# Patient Record
Sex: Male | Born: 1975
Health system: Southern US, Community
[De-identification: ages and names within clinical notes are randomized; demographics above are authoritative.]

## PROBLEM LIST (undated history)

## (undated) DIAGNOSIS — R079 Chest pain, unspecified: Secondary | ICD-10-CM

## (undated) DIAGNOSIS — T7840XA Allergy, unspecified, initial encounter: Secondary | ICD-10-CM

## (undated) DIAGNOSIS — E785 Hyperlipidemia, unspecified: Secondary | ICD-10-CM

## (undated) HISTORY — PX: KNEE ARTHROSCOPY: SUR90

## (undated) HISTORY — DX: Allergy, unspecified, initial encounter: T78.40XA

---

## 2002-09-06 ENCOUNTER — Emergency Department (HOSPITAL_COMMUNITY): Admission: EM | Admit: 2002-09-06 | Discharge: 2002-09-06 | Payer: Self-pay | Admitting: Emergency Medicine

## 2002-09-08 ENCOUNTER — Emergency Department (HOSPITAL_COMMUNITY): Admission: EM | Admit: 2002-09-08 | Discharge: 2002-09-08 | Payer: Self-pay | Admitting: Emergency Medicine

## 2002-11-10 ENCOUNTER — Emergency Department (HOSPITAL_COMMUNITY): Admission: EM | Admit: 2002-11-10 | Discharge: 2002-11-10 | Payer: Self-pay | Admitting: Emergency Medicine

## 2003-11-13 ENCOUNTER — Inpatient Hospital Stay (HOSPITAL_COMMUNITY): Admission: AD | Admit: 2003-11-13 | Discharge: 2003-11-16 | Payer: Self-pay | Admitting: Emergency Medicine

## 2004-07-29 ENCOUNTER — Emergency Department (HOSPITAL_COMMUNITY): Admission: EM | Admit: 2004-07-29 | Discharge: 2004-07-29 | Payer: Self-pay | Admitting: Emergency Medicine

## 2004-12-10 ENCOUNTER — Emergency Department (HOSPITAL_COMMUNITY): Admission: EM | Admit: 2004-12-10 | Discharge: 2004-12-10 | Payer: Self-pay | Admitting: Emergency Medicine

## 2005-07-08 ENCOUNTER — Emergency Department (HOSPITAL_COMMUNITY): Admission: EM | Admit: 2005-07-08 | Discharge: 2005-07-08 | Payer: Self-pay | Admitting: Family Medicine

## 2005-09-30 ENCOUNTER — Emergency Department (HOSPITAL_COMMUNITY): Admission: EM | Admit: 2005-09-30 | Discharge: 2005-09-30 | Payer: Self-pay | Admitting: Emergency Medicine

## 2005-12-26 ENCOUNTER — Inpatient Hospital Stay (HOSPITAL_COMMUNITY): Admission: RE | Admit: 2005-12-26 | Discharge: 2005-12-30 | Payer: Self-pay | Admitting: Psychiatry

## 2005-12-27 ENCOUNTER — Ambulatory Visit: Payer: Self-pay | Admitting: Psychiatry

## 2010-10-12 ENCOUNTER — Encounter (INDEPENDENT_AMBULATORY_CARE_PROVIDER_SITE_OTHER): Payer: Self-pay | Admitting: Nurse Practitioner

## 2010-10-12 ENCOUNTER — Ambulatory Visit
Admission: RE | Admit: 2010-10-12 | Discharge: 2010-10-12 | Payer: Self-pay | Source: Home / Self Care | Attending: Nurse Practitioner | Admitting: Nurse Practitioner

## 2010-10-12 DIAGNOSIS — R5381 Other malaise: Secondary | ICD-10-CM | POA: Insufficient documentation

## 2010-10-12 DIAGNOSIS — M25519 Pain in unspecified shoulder: Secondary | ICD-10-CM | POA: Insufficient documentation

## 2010-10-12 DIAGNOSIS — N529 Male erectile dysfunction, unspecified: Secondary | ICD-10-CM | POA: Insufficient documentation

## 2010-10-12 DIAGNOSIS — F39 Unspecified mood [affective] disorder: Secondary | ICD-10-CM | POA: Insufficient documentation

## 2010-10-12 DIAGNOSIS — R5383 Other fatigue: Secondary | ICD-10-CM

## 2010-10-12 DIAGNOSIS — F17200 Nicotine dependence, unspecified, uncomplicated: Secondary | ICD-10-CM | POA: Insufficient documentation

## 2010-10-12 DIAGNOSIS — M25569 Pain in unspecified knee: Secondary | ICD-10-CM | POA: Insufficient documentation

## 2010-10-12 DIAGNOSIS — E669 Obesity, unspecified: Secondary | ICD-10-CM | POA: Insufficient documentation

## 2010-10-12 LAB — CONVERTED CEMR LAB
ALT: 14 units/L (ref 0–53)
AST: 16 units/L (ref 0–37)
Albumin: 4.7 g/dL (ref 3.5–5.2)
Alkaline Phosphatase: 72 units/L (ref 39–117)
BUN: 12 mg/dL (ref 6–23)
Basophils Absolute: 0.1 10*3/uL (ref 0.0–0.1)
Basophils Relative: 1 % (ref 0–1)
CO2: 27 meq/L (ref 19–32)
Calcium: 9.8 mg/dL (ref 8.4–10.5)
Chloride: 102 meq/L (ref 96–112)
Creatinine, Ser: 0.93 mg/dL (ref 0.40–1.50)
Eosinophils Absolute: 0.4 10*3/uL (ref 0.0–0.7)
Eosinophils Relative: 6 % — ABNORMAL HIGH (ref 0–5)
Glucose, Bld: 80 mg/dL (ref 70–99)
HCT: 43.9 % (ref 39.0–52.0)
Hemoglobin: 15.1 g/dL (ref 13.0–17.0)
Lymphocytes Relative: 24 % (ref 12–46)
Lymphs Abs: 1.5 10*3/uL (ref 0.7–4.0)
MCHC: 34.4 g/dL (ref 30.0–36.0)
MCV: 92.8 fL (ref 78.0–100.0)
Monocytes Absolute: 0.5 10*3/uL (ref 0.1–1.0)
Monocytes Relative: 8 % (ref 3–12)
Neutro Abs: 3.9 10*3/uL (ref 1.7–7.7)
Neutrophils Relative %: 61 % (ref 43–77)
Platelets: 249 10*3/uL (ref 150–400)
Potassium: 4.7 meq/L (ref 3.5–5.3)
RBC: 4.73 M/uL (ref 4.22–5.81)
RDW: 13.4 % (ref 11.5–15.5)
Rapid HIV Screen: NEGATIVE
Sex Hormone Binding: 42 nmol/L (ref 13–71)
Sodium: 139 meq/L (ref 135–145)
TSH: 0.46 microintl units/mL (ref 0.350–4.500)
Testosterone Free: 92.7 pg/mL (ref 47.0–244.0)
Testosterone-% Free: 1.8 % (ref 1.6–2.9)
Testosterone: 512.13 ng/dL (ref 250–890)
Total Bilirubin: 0.3 mg/dL (ref 0.3–1.2)
Total Protein: 7.2 g/dL (ref 6.0–8.3)
WBC: 6.4 10*3/uL (ref 4.0–10.5)

## 2010-10-14 ENCOUNTER — Encounter (INDEPENDENT_AMBULATORY_CARE_PROVIDER_SITE_OTHER): Payer: Self-pay | Admitting: Nurse Practitioner

## 2010-10-27 NOTE — Assessment & Plan Note (Signed)
Summary: NEW - Establish Care   Vital Signs:  Patient profile:   35 year old male Height:      71 inches Weight:      196 pounds BMI:     27.44 Temp:     97.7 degrees F oral Pulse rate:   72 / minute Pulse rhythm:   regular Resp:     18 per minute BP sitting:   120 / 70  (left arm) Cuff size:   regular  Vitals Entered By: Armenia Shannon (October 12, 2010 9:48 AM)  Nutrition Counseling: Patient's BMI is greater than 25 and therefore counseled on weight management options. CC: NP...Marland Kitchen pt says his right shoulder has a knot on it and hurt... pt did not use any thing on  shoulder... pt says he takes abilify 5mg  but did not bring bottle... Is Patient Diabetic? No Pain Assessment Patient in pain? no       Does patient need assistance? Functional Status Self care Ambulation Normal   CC:  NP...Marland Kitchen pt says his right shoulder has a knot on it and hurt... pt did not use any thing on  shoulder... pt says he takes abilify 5mg  but did not bring bottle....  History of Present Illness:  Pt into the office to establsh care  PMH - nonsignificant PSH - left arthroscopy in 2010  Right shoulder - "knot" present x 6 months ago. About 6 months ago he noticed that his shoulder started was hurting.  Shortly after that he noticed and a lump on his shoulder.  Not a major problem but just notices from time to time. Right hand dominant.  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 no     Have you felt little pleasure in things? no     Depression Counseling: not indicated; screening negative for depression     Drug Use:  never  Current Medications (verified): 1)  None  Allergies (verified): No Known Drug Allergies  Past History:  Past Surgical History: left surgery arthoscopy 2010  Family History: mother - htn father - htn 2 sisters - healthy 1 brother - healthy  Social History: 6 children married tobacco - 1 ppd ETOH - quit 2010 Drug - never Smoking Status:  current Packs/Day:  0.5 Drug Use:  never  Review of Systems General:  Denies fever. CV:  Denies chest pain or discomfort. Resp:  Denies cough. GI:  Denies abdominal pain, nausea, and vomiting. GU:  Complains of erectile dysfunction; Pt indicates that his urge to perform had decreased. He is married and states that both his urge and ability to perform has decreased. MS:  Complains of joint pain.  Physical Exam  General:  alert.   Head:  normocephalic.   Lungs:  normal breath sounds.   Heart:  normal rate and regular rhythm.   Abdomen:  normal bowel sounds.   Neurologic:  alert & oriented X3.     Shoulder/Elbow Exam  Shoulder Exam:    Right:    Inspection:  Normal  Location:  right AC joint    Stability:  stable    Tenderness:  no    Swelling:  no    Erythema:  no   Impression & Recommendations:  Problem # 1:  SHOULDER PAIN, RIGHT (ICD-719.41) advised pt this is likely environmental will start ibuprofen heat to affected area  Problem # 2:  NEED PROPHYLACTIC VACCINATION&INOCULATION FLU (ICD-V04.81) given today in office  Problem # 3:  TOBACCO ABUSE (ICD-305.1) advised cessation  Problem # 4:  OBESITY (ICD-278.00)  advised pt that he needs to exercise  Orders: T-TSH ([address])  Problem # 5:  FATIGUE (ICD-780.79) will check labs today Orders: T-Comprehensive Metabolic Panel (09811-91478) T-CBC w/Diff (29562-13086) T-Testosterone, Free and Total [phone]) T-TSH (32440-10272)  Complete Medication List: 1)  Abilify 5 Mg Tabs (Aripiprazole) .... One tablet by mouth daily  Other  Orders: Flu Vaccine 7yrs + (53664) Admin 1st Vaccine (40347) Rapid HIV  (42595)  Patient Instructions: 1)  Sign a release to get records from  2)  [facility] 3)  [address] 4)  [place]  [address] 5)  Right shoulder - most likely from overuse.  May take ibuprofen as needed for pain and can apply heat.  If it continues to be a problem then you may need x-rays.  Try to alternate sleeping on your shoulder at night 6)  Your labs will be checked today and you will be notified of the results. 7)  Follow up as needed   Orders Added: 1)  Flu Vaccine 39yrs + [90658] 2)  Admin 1st Vaccine [90471] 3)  New Patient Level III [99203] 4)  T-Comprehensive Metabolic Panel [80053-22900] 5)  T-CBC w/Diff [64332-95188] 6)  T-Testosterone, Free and Total [84270-[address]] 7)  Rapid HIV  [92370] 8)  T-TSH [41660-63016]   Immunizations Administered:  Influenza Vaccine # 1:    Vaccine Type: Fluvax 3+    Site: right deltoid    Mfr: GlaxoSmithKline    Dose: 0.5 ml    Route: IM    Given by: Armenia Shannon    Exp. Date: 03/24/2010    Lot #: [id number]    VIS given: 04/19/10 version given October 12, 2010.  Flu Vaccine Consent Questions:    Do you have a history of severe allergic reactions to this vaccine? no    Any prior history of allergic reactions to egg and/or gelatin? no    Do you have a sensitivity to the preservative Thimersol? no    Do you have a past history of Guillan-Barre Syndrome? no    Do you currently have an acute febrile illness? no    Have you ever had a severe reaction to latex? no    Vaccine information given and explained to patient? yes   Immunizations Administered:  Influenza Vaccine # 1:    Vaccine Type: Fluvax 3+    Site: right deltoid    Mfr: GlaxoSmithKline    Dose: 0.5 ml    Route: IM    Given by: Armenia Shannon    Exp. Date: 03/24/2010    Lot #: [id number]    VIS given: 04/19/10 version given October 12, 2010.      Prevention & Chronic Care Immunizations   Influenza vaccine: Fluvax 3+  (10/12/2010)    Tetanus booster: Not documented    Pneumococcal vaccine: Not documented  Other Screening   Smoking status: current  (10/12/2010)  Lipids   Total Cholesterol: Not documented   LDL: Not documented   LDL Direct:  Not documented   HDL: Not documented   Triglycerides: Not documented   Nursing Instructions: Give Flu vaccine today   Laboratory Results    Other Tests  Rapid HIV: negative

## 2010-10-27 NOTE — Letter (Signed)
Summary: *HSN Results Follow up  [facility]  [address] [place], [place] [address]   Phone: [phone]  Fax: [phone]      10/14/2010   [name] [address] [place], [place]  [address]   Dear  [name],                            ____S.Drinkard,FNP   ____D. Gore,FNP       ____B. McPherson,MD   ____V. Rankins,MD    ____E. Mulberry,MD    _X___N. Daphine Deutscher, FNP  ____D. Reche Dixon, MD    ____K. Philipp Deputy, MD    ____Other     This letter is to inform you that your recent test(s):  _______Pap Smear    ___X____Lab Test     _______X-ray    ___X____ is within acceptable limits  _______ requires a medication change  _______ requires a follow-up lab visit  _______ requires a follow-up visit with your provider   Comments:  Labs done during recent office visit are normal.       _________________________________________________________ If you have any questions, please contact our office [phone].                    Sincerely,    Lehman Prom FNP [facility]

## 2011-02-10 NOTE — Discharge Summary (Signed)
NAME:  Keith Green, Keith Green                       ACCOUNT NO.:  [id number]   MEDICAL RECORD NO.:  [id number]                   PATIENT TYPE:  INP   LOCATION:  5714                                 FACILITY:  [facility]   PHYSICIAN:  Madaline Guthrie, M.D.                 DATE OF BIRTH:  1976/01/27   DATE OF ADMISSION:  11/13/2003  DATE OF DISCHARGE:  11/16/2003                                 DISCHARGE SUMMARY   DISCHARGE DIAGNOSES:  1. Acute pancreatitis.  2. Polysubstance abuse.   DISCHARGE MEDICATIONS:  None.   DISPOSITION:  Keith Green was discharged home in stable condition.  He was  to follow up at [facility] on November 30, 2003 at 1:30 p.m.  to ensure his pancreatitis is resolved.  It would also be good to discuss  further alcohol and substance abuse cessation at that time and determine if  the patient contacted ACS .   PROCEDURES:  On the day of admission, the patient had an abdominal  ultrasound to rule out gallstones.  There was no evidence of stones or  gallbladder wall thickening and no evidence of ductal dilatation.  The  kidneys showed no evidence of hydronephrosis or masses.  There was no  evidence of splenomegaly or abdominal aortic aneurysm.  There was no  evidence of ascites.  Hospital day #1, the patient underwent a CT of the  abdomen and pelvis which showed inflammation in the area of the gallbladder  and the head of the pancreas.  It showed some slight prominence of the  common duct and pancreatic duct.  The distal common duct obstruction at the  ampulla could not be excluded.  The pelvis showed a small amount of free  fluid.   CONSULTATIONS:  [facility] Assessment Team.   HISTORY AND PHYSICAL:  Keith Green is a 35 year old male with a history  significant for polysubstance abuse who presented on day of admission with  severe abdominal pain, nausea, vomiting, and diarrhea.  He states that he  drinks about four 40-ounce beers daily.  He says that one week  prior to  admission he had had an evening of excessive alcohol and was drinking liquor  on top of his normal daily beer.  He reports that this is the first episode  of this type of pain.  He reports to having had shakes with withdrawal but  no history of seizures or DT.  He does drink some mornings when he wakes up  and he thinks that he presently drinks too much.  His labs on admission were  sodium 139, potassium 3.3, chloride 102, bicarb 28, BUN 7, creatinine 0.9,  glucose 163.  His bilirubin was 0.3, alkaline phosphatase was 83, AST was  52, ALT 49, total protein was 6.8, albumin 3.9, calcium 8.8.  His lipase on  admission was 778.  His serum alcohol was 268.  His urine drug screen was  significant for cocaine.  Urinalysis was within normal limits.  His  admission vital signs were pulse 73, blood pressure 131/71, temperature  96.9, respiratory rate 20.  His oxygen saturation was 98% on room air.  The  patient was lying in bed in no apparent distress.  Pupils equal, round,  reactive to light.  His throat was negative for any erythema.  His neck was  supple with no lymphadenopathy.  Respirations were clear to auscultation  bilaterally without wheezes, rales, or rhonchi.  Cardiovascular exam was  regular rate and rhythm with no murmurs, rubs, or gallops.  Abdomen was soft  and tender in the right upper quadrant.  Positive bowel sounds.  There was  some guarding but no rebound tenderness.  Lower extremities showed no edema  and 2+ dorsalis pedis pulses bilaterally.   HOSPITAL PROBLEMS:  Problem 1.  ACUTE PANCREATITIS:  On day of admission,  abdominal ultrasound was done to rule out gallstones.  Ultrasound was  negative for any signs of gallbladder inflammation and gallstones.  However,  an abdominal CT was ordered for the following day.  The CT did show some  inflammation in the area of the pancreas and gallbladder as was expected.  However, his symptoms were not related to food intake and  this was a rather  abrupt onset.  Due to his extensive alcohol background, it was felt that his  pancreatitis was most likely alcohol etiology.  No further imaging was done.  The patient was initially placed on IV fluids and morphine PCA while being  NPO.  Hospital day #1, the patient stated that he did have a desire to eat  and the pain had decreased so his PCA was stopped.  Later in the day he did  tolerate clear liquids without any nausea or vomiting.  The next day he was  started on a regular diet which he tolerated without problems.  The  following morning he felt fine, afebrile, without pain having not had any  pain medications and tolerating good p.o.   Problem 2.  POLYSUBSTANCE ABUSE:  I spoke with the patient on 2 occasions  about the need to stop the alcohol.  He also spoke with the [facility] Assessment  Team on hospital day #1 and received numbers and information for ACS.  The  patient reports that he does realize he has been drinking more over the past  year since he has been laid off and feels like he needs to stop.   DISCHARGE VITALS:  Temperature was 97, pulse ranged from 57 to 80, blood  pressure was 135 to 158 over 76 to 96, respiratory rate was 18 to 20.  Oxygen saturation was 96 to 100% on room air.  His most recent CBC prior to  discharge showed a white blood cell count of 11, hemoglobin and hematocrit  were 15.7 and 45.4 respectively, platelets were 192.  He had an amylase done  that was 174 also consistent with pancreatitis.  Had a repeat lipase which  was 46.      Eliseo Gum, M.D.                      Madaline Guthrie, M.D.    KC/MEDQ  D:  11/20/2003  T:  11/21/2003  Job:  [id number]

## 2011-02-10 NOTE — Discharge Summary (Signed)
Keith Green, Keith Green            ACCOUNT NO.:  [id number]   MEDICAL RECORD NO.:  [id number]          PATIENT TYPE:  IPS   LOCATION:  0403                          [facility]ILITY[facility]  [facility]   PHYSICIAN:  Geoffery Lyons, M.D.      DATE OF BIRTH:  11-19-1975   DATE OF ADMISSION:  12/26/2005  DATE OF DISCHARGE:  12/30/2005                                 DISCHARGE SUMMARY   CHIEF COMPLAINT AND PRESENT ILLNESS:  This is the first admission to [facility] for this 35 year old single African-American male  voluntarily admitted.  History of auditory hallucinations, have been  intense, hearing voices that are not command.  They get him up at night.  He  has been having hallucinations for years, since age 40.  Has never been  treated before for any mental illness.  His wife is worried because the  patient has been staying up all night due to hearing the voices.  Appetite  has been fair.  Denies any substance abuse.  Denies any active suicidal or  homicidal ideation.   PAST PSYCHIATRIC HISTORY:  First time at [facility], no current  outpatient treatment.   ALCOHOL AND DRUG HISTORY:  Denies abuse of any substances.   MEDICAL HISTORY:  Noncontributory.   MEDICATIONS:  None.   PHYSICAL EXAMINATION:  Performed and failed to show any acute findings.   LABORATORY WORK UP:  CBC, white blood cells 5.8, hemoglobin 15.2.  Blood  chemistry, sodium 138, potassium 3.7, glucose 83.  Liver enzymes, SGOT 37,  SGPT 39, total bilirubin 0.9.  TSH 1.427.  Drug screen negative for  substance abuse.   MENTAL STATUS EXAM:  Reveals a fully alert, cooperative male with good eye  contact.  Very soft spoken, mood is neutral.  Affect is pleasant, somewhat  flat.  Thought process, endorses auditory hallucinations.  No evidence of  delusion, no suicidal or homicidal ideation.  Cognition well-preserved.   ADMISSION DIAGNOSES:  AXIS I:  Psychotic disorder, not otherwise specified.  AXIS II:  No  diagnosis.  AXIS III:  No diagnosis.  AXIS IV:  Moderate.  AXIS V:  GAF upon admission 30; highest in the last year 70.   HOSPITAL COURSE:  He was admitted.  He was started in individual and group  psychotherapy.  He was given some Ativan and some Haldol to begin with.  He  was placed on Risperdal.  He was given Ambien for sleep.  Endorses that he  has been hearing voices for the last 2 weeks, cannot explain what is going  on.  Brother history of schizophrenia.  He was killed in an accident.  He  himself is facing charges.  Will go to court in April.  Past history of  criminal behaviors, lost a good job due to his past criminal behavior.  Pending some drug charges, pretty upset with the situation.  __________.  Help him sleep, also new baby.  There were some elements of being anxious,  panicky, sleeping.  As we increased the medication the voices started  decreasing.  His wife was supportive.  But by April  6 he woke up with the  voices, very upset with the situation.  We continued to increase the  Risperdal.  On April 7 he endorsed that he had been doing really well.  He  slept all night.  There were no voices.  His mood was better.  His affect  was brighter, committed to doing well.  Willing to go home and face all the  stressors that he has to face.  Encouraged, committed to doing well.   DISCHARGE DIAGNOSES:  AXIS I:  Psychotic disorder, not otherwise specified.  AXIS II:  No diagnosis.  AXIS III:  No diagnosis.  AXIS IV:  Moderate.  AXIS V:  GAF upon discharge 50-55.   DISCHARGE MEDICATIONS:  1.  Risperdal 0.5 mg one-half twice a day and three at bedtime.  2.  Ambien 10 mg at bedtime as needed for sleep.  3.  __________ 25 mg three times a day as needed.   FOLLOW UP:  Alinda Money __________ and [facility].      Geoffery Lyons, M.D.  Electronically Signed     IL/MEDQ  D:  01/09/2006  T:  01/10/2006  Job:  [id number]

## 2011-02-10 NOTE — H&P (Signed)
NAMECEBASTIAN, Keith Green            ACCOUNT NO.:  [id number]   MEDICAL RECORD NO.:  [id number]          PATIENT TYPE:  IPS   LOCATION:  0403                          [facility]ILITY[facility]  [facility]   PHYSICIAN:  Geoffery Lyons, M.D.      DATE OF BIRTH:  January 12, 1976   DATE OF ADMISSION:  12/26/2005  DATE OF DISCHARGE:  12/30/2005                         PSYCHIATRIC ADMISSION ASSESSMENT   IDENTIFYING INFORMATION:  This is a 35 year old single African-American male  voluntarily admitted on December 26, 2005.   HISTORY OF PRESENT ILLNESS:  The patient presents with a history of auditory  hallucinations, states that they have been intense, hearing voices that are  non-command.  The patient states that they keep him up at night.  He has  been having hallucinations for years since the age of 36.  He states he has  never been treated before for any mental illness.  He states his wife is  worried because the patient has been staying up all night due to him hearing  voices.  His appetite has been fair.  He denies any substance abuse.  He  denies any suicidal or homicidal ideation.  The patient states that one of  his stressors is that he was recently terminated from work due to his past  criminal record.   PAST PSYCHIATRIC HISTORY:  First admission to [facility].  No  current outpatient treatment.  There is no prior suicidal gestures or  attempt.   SOCIAL HISTORY:  This is a 35 year old married African-American male who has  three children, age 26, 68 and 75 months.  His wife is currently pregnant and  due in April.  The patient lives with his wife and three children.  Unemployed.  He has completed 10 years in school and got his GED.  Has a  court date upcoming for assault on a government official.  Has been in jail  multiple times and was charged with armed robbery in the past.   FAMILY HISTORY:  Brother with schizophrenia.   ALCOHOL/DRUG HISTORY:  The patient smokes.  Denies any alcohol or drug  use.   PRIMARY CARE PHYSICIAN:  None.   MEDICAL PROBLEMS:  None.   MEDICATIONS:  None.   ALLERGIES:  No known allergies.   PHYSICAL EXAMINATION:  This is a healthy-appearing male.  He was assessed at  [facility].  Temperature 97.3, heart rate 80,  respiratory rate 20, 99% saturated.  He is 6 feet 1 inch tall, weight 171  pounds.   REVIEW OF SYSTEMS:  Noncontributory.   LABORATORY DATA:  CBC is within normal limits.  CMET is within normal  limits.  TSH is 1.427.  Acetaminophen level less than 10.  Salicylate level  less than 4.   MENTAL STATUS EXAM:  This is a fully alert, cooperative male with little eye  contact.  Speech is soft-spoken.  His mood is neutral.  His affect is  pleasant, somewhat flat.  Thought processes endorsing auditory  hallucinations.  No suicidal ideation.  Cognitive function is intact.  Memory is fair.  Judgment and insight are fair.  Concentration intact.  DIAGNOSES:  AXIS I:  Psychosis not otherwise specified.  AXIS II:  Deferred.  AXIS III:  None.  AXIS IV:  Problems with occupation, legal system.  AXIS V:  Current 30.   PLAN:  Stabilize mood and thinking.  Contract for safety.  Will initiate  Risperdal for psychotic symptoms.  Will have a family session with wife for  support.  The patient is to be medication compliant.  To follow up with  [facility].   TENTATIVE LENGTH OF STAY:  Four to five days.      Landry Corporal, N.P.      Geoffery Lyons, M.D.  Electronically Signed    JO/MEDQ  D:  12/30/2005  T:  01/01/2006  Job:  [id number]

## 2011-10-22 ENCOUNTER — Emergency Department (HOSPITAL_COMMUNITY)
Admission: EM | Admit: 2011-10-22 | Discharge: 2011-10-22 | Disposition: A | Discharge status: Home / Self Care | Payer: Medicaid Other | Attending: Emergency Medicine | Admitting: Emergency Medicine

## 2011-10-22 ENCOUNTER — Encounter (HOSPITAL_COMMUNITY): Payer: Self-pay | Admitting: *Deleted

## 2011-10-22 DIAGNOSIS — S61209A Unspecified open wound of unspecified finger without damage to nail, initial encounter: Secondary | ICD-10-CM | POA: Insufficient documentation

## 2011-10-22 DIAGNOSIS — W292XXA Contact with other powered household machinery, initial encounter: Secondary | ICD-10-CM | POA: Insufficient documentation

## 2011-10-22 DIAGNOSIS — S61319A Laceration without foreign body of unspecified finger with damage to nail, initial encounter: Secondary | ICD-10-CM

## 2011-10-22 MED ORDER — TETANUS-DIPHTH-ACELL PERTUSSIS 5-2.5-18.5 LF-MCG/0.5 IM SUSP
0.5000 mL | Freq: Once | INTRAMUSCULAR | Status: AC
  Administered 2011-10-22: 0.5 mL via INTRAMUSCULAR
  Filled 2011-10-22: qty 0.5

## 2011-10-22 NOTE — ED Provider Notes (Signed)
Medical screening examination/treatment/procedure(s) were performed by non-physician practitioner and as supervising physician I was immediately available for consultation/collaboration.    Ndeye Tenorio R Glyn Gerads, MD 10/22/11 0632 

## 2011-10-22 NOTE — ED Provider Notes (Signed)
History     CSN: [id number]  Arrival date & time 10/22/11  0009   None     Chief Complaint  Patient presents with  . Laceration    (Consider location/radiation/quality/duration/timing/severity/associated sxs/prior treatment) HPI Comments: Patient was using a slicer about 10 AM this morning when he accidentally caught his right middle finger distal tip in slicer.  He washed the area and applied a Band-Aid.  His head to change the bandage.  Several times today as he keeps using his tetanus status is out of date  Patient is a 36 y.o. male presenting with skin laceration. The history is provided by the patient.  Laceration  The incident occurred 12 to 24 hours ago. The laceration is 1 cm in size. The pain is at a severity of 2/10. The pain is mild. The pain has been constant since onset. His tetanus status is out of date.    History reviewed. No pertinent past medical history.  History reviewed. No pertinent past surgical history.  History reviewed. No pertinent family history.  History  Substance Use Topics  . Smoking status: Current Everyday Smoker  . Smokeless tobacco: Not on file  . Alcohol Use: No      Review of Systems  Allergies  Review of patient's allergies indicates no known allergies.  Home Medications  No current outpatient prescriptions on file.  BP 131/86  Pulse 64  Temp(Src) 97.5 F (36.4 C) (Oral)  Resp 16  SpO2 98%  Physical Exam  ED Course  Procedures (including critical care time)  Labs Reviewed - No data to display No results found.   1. Laceration of finger with damage to nail without foreign body       MDM  Careful assessment of the laceration reveals that he has approximately 1/3 of the way down.  A partial laceration through the nail not involving the nail bed.  There is no active bleeding is a small 3 mm superficial laceration to the distal tip of the finger, not requiring sutures        Arman Filter, NP 10/22/11 0122

## 2011-10-22 NOTE — ED Notes (Signed)
Laceration to the rt middle finger.  He did on a slicer this am 100am at work.  [employer] active bleeding

## 2012-04-24 ENCOUNTER — Encounter (HOSPITAL_COMMUNITY): Payer: Self-pay | Admitting: Emergency Medicine

## 2012-04-24 ENCOUNTER — Emergency Department (HOSPITAL_COMMUNITY)
Admission: EM | Admit: 2012-04-24 | Discharge: 2012-04-24 | Disposition: A | Discharge status: Home / Self Care | Payer: Medicaid Other | Attending: Emergency Medicine | Admitting: Emergency Medicine

## 2012-04-24 DIAGNOSIS — F17200 Nicotine dependence, unspecified, uncomplicated: Secondary | ICD-10-CM | POA: Insufficient documentation

## 2012-04-24 DIAGNOSIS — I10 Essential (primary) hypertension: Secondary | ICD-10-CM | POA: Insufficient documentation

## 2012-04-24 DIAGNOSIS — R109 Unspecified abdominal pain: Secondary | ICD-10-CM

## 2012-04-24 LAB — BASIC METABOLIC PANEL
BUN: 11 mg/dL (ref 6–23)
CO2: 27 mEq/L (ref 19–32)
Calcium: 9.5 mg/dL (ref 8.4–10.5)
Chloride: 103 mEq/L (ref 96–112)
Creatinine, Ser: 1.04 mg/dL (ref 0.50–1.35)
GFR calc Af Amer: >90 mL/min (ref >90)
GFR calc non Af Amer: 90 mL/min — ABNORMAL LOW (ref >90)
Glucose, Bld: 102 mg/dL — ABNORMAL HIGH (ref 70–99)
Potassium: 3.8 mEq/L (ref 3.5–5.1)
Sodium: 140 mEq/L (ref 135–145)

## 2012-04-24 LAB — URINALYSIS, ROUTINE W REFLEX MICROSCOPIC
Bilirubin Urine: NEGATIVE
Glucose, UA: NEGATIVE mg/dL
Hgb urine dipstick: NEGATIVE
Ketones, ur: NEGATIVE mg/dL
Leukocytes, UA: NEGATIVE
Nitrite: NEGATIVE
Protein, ur: NEGATIVE mg/dL
Specific Gravity, Urine: 1.023 (ref 1.005–1.030)
Urobilinogen, UA: 0.2 mg/dL (ref 0.0–1.0)
pH: 5.5 (ref 5.0–8.0)

## 2012-04-24 LAB — HEPATIC FUNCTION PANEL
ALT: 15 U/L (ref 0–53)
AST: 15 U/L (ref 0–37)
Albumin: 3.9 g/dL (ref 3.5–5.2)
Alkaline Phosphatase: 90 U/L (ref 39–117)
Bilirubin, Direct: <0.1 mg/dL (ref 0.0–0.3)
Total Bilirubin: 0.3 mg/dL (ref 0.3–1.2)
Total Protein: 7.1 g/dL (ref 6.0–8.3)

## 2012-04-24 LAB — CBC WITH DIFFERENTIAL/PLATELET
Basophils Absolute: 0 10*3/uL (ref 0.0–0.1)
Basophils Relative: 1 % (ref 0–1)
Eosinophils Absolute: 0.5 10*3/uL (ref 0.0–0.7)
Eosinophils Relative: 6 % — ABNORMAL HIGH (ref 0–5)
HCT: 43.4 % (ref 39.0–52.0)
Hemoglobin: 15.2 g/dL (ref 13.0–17.0)
Lymphocytes Relative: 36 % (ref 12–46)
Lymphs Abs: 2.6 10*3/uL (ref 0.7–4.0)
MCH: 30.6 pg (ref 26.0–34.0)
MCHC: 35 g/dL (ref 30.0–36.0)
MCV: 87.3 fL (ref 78.0–100.0)
Monocytes Absolute: 0.6 10*3/uL (ref 0.1–1.0)
Monocytes Relative: 9 % (ref 3–12)
Neutro Abs: 3.5 10*3/uL (ref 1.7–7.7)
Neutrophils Relative %: 48 % (ref 43–77)
Platelets: 234 10*3/uL (ref 150–400)
RBC: 4.97 MIL/uL (ref 4.22–5.81)
RDW: 13 % (ref 11.5–15.5)
WBC: 7.2 10*3/uL (ref 4.0–10.5)

## 2012-04-24 LAB — LIPASE, BLOOD: Lipase: 24 U/L (ref 11–59)

## 2012-04-24 MED ORDER — SODIUM CHLORIDE 0.9 % IV BOLUS (SEPSIS)
1000.0000 mL | Freq: Once | INTRAVENOUS | Status: AC
  Administered 2012-04-24: 1000 mL via INTRAVENOUS

## 2012-04-24 MED ORDER — HYDROMORPHONE HCL PF 1 MG/ML IJ SOLN
1.0000 mg | Freq: Once | INTRAMUSCULAR | Status: AC
  Administered 2012-04-24: 1 mg via INTRAVENOUS
  Filled 2012-04-24: qty 1

## 2012-04-24 MED ORDER — ONDANSETRON HCL 4 MG/2ML IJ SOLN
4.0000 mg | Freq: Once | INTRAMUSCULAR | Status: AC
  Administered 2012-04-24: 4 mg via INTRAVENOUS
  Filled 2012-04-24: qty 2

## 2012-04-24 MED ORDER — NAPROXEN 500 MG PO TABS: 500.0000 mg | ORAL_TABLET | Freq: Two times a day (BID) | ORAL | Status: DC

## 2012-04-24 NOTE — ED Notes (Signed)
PT. WOKE UP THIS MORNING WITH MID ABDOMINAL PAIN / CRAMPING AND NAUSEA.

## 2012-04-24 NOTE — ED Provider Notes (Addendum)
History     CSN: [id number]  Arrival date & time 04/24/12  5784   First MD Initiated Contact with Patient 04/24/12 [phone]      Chief Complaint  Patient presents with  . Abdominal Pain    (Consider location/radiation/quality/duration/timing/severity/associated sxs/prior treatment) HPI Comments: Awoke at 3 AM with "stomach in a knot"  Loose stools when awoke, now has ongoing pain.  Pain is located in the mid abdomen points "all over" and does not radiate to the back.  nasuea but no vomiting.  No f/c/cough/sob,rash/swelling.  No dysuria or hematuria.  No other PMH and was normal when he went to sleep last night.  No travel, no sick contacts, no recent exposures.  Last meal was pig feet but nothing out of the ordinary.  On no other meds.   Patient is a 36 y.o. male presenting with abdominal pain.  Abdominal Pain The primary symptoms of the illness include abdominal pain.    History reviewed. No pertinent past medical history.  History reviewed. No pertinent past surgical history.  No family history on file.  History  Substance Use Topics  . Smoking status: Current Everyday Smoker  . Smokeless tobacco: Not on file  . Alcohol Use: No      Review of Systems  Gastrointestinal: Positive for abdominal pain.  All other systems reviewed and are negative.    Allergies  Review of patient's allergies indicates no known allergies.  Home Medications   Current Outpatient Rx  Name Route Sig Dispense Refill  . NAPROXEN 500 MG PO TABS Oral Take 1 tablet (500 mg total) by mouth 2 (two) times daily with a meal. 30 tablet 0    BP 141/102  Pulse 72  Temp 97.8 F (36.6 C) (Oral)  Resp 20  SpO2 100%  Physical Exam  Nursing note and vitals reviewed. Constitutional: He appears well-developed and well-nourished. No distress.  HENT:  Head: Normocephalic and atraumatic.  Mouth/Throat: Oropharynx is clear and moist. No oropharyngeal exudate.  Eyes: Conjunctivae and EOM are normal.  Pupils are equal, round, and reactive to light. Right eye exhibits no discharge. Left eye exhibits no discharge. No scleral icterus.  Neck: Normal range of motion. Neck supple. No JVD present. No thyromegaly present.  Cardiovascular: Normal rate, regular rhythm, normal heart sounds and intact distal pulses.  Exam reveals no gallop and no friction rub.   No murmur heard. Pulmonary/Chest: Effort normal and breath sounds normal. No respiratory distress. He has no wheezes. He has no rales.  Abdominal: Soft. Bowel sounds are normal. He exhibits no distension and no mass. There is tenderness ( mid abd ttp without guarding or peritoneal signs.).  Musculoskeletal: Normal range of motion. He exhibits no edema and no tenderness.  Lymphadenopathy:    He has no cervical adenopathy.  Neurological: He is alert. Coordination normal.  Skin: Skin is warm and dry. No rash noted. No erythema.  Psychiatric: He has a normal mood and affect. His behavior is normal.    ED Course  Procedures (including critical care time)  Labs Reviewed  BASIC METABOLIC PANEL - Abnormal; Notable for the following:    Glucose, Bld 102 (*)     GFR calc non Af Amer 90 (*)     All other components within normal limits  CBC WITH DIFFERENTIAL - Abnormal; Notable for the following:    Eosinophils Relative 6 (*)     All other components within normal limits  LIPASE, BLOOD  HEPATIC FUNCTION PANEL  URINALYSIS, ROUTINE W  REFLEX MICROSCOPIC   No results found.   1. Abdominal pain   2. Hypertension       MDM  At this time the patient appears clinically stable though he does have some reproducible midabdominal tenderness. With his history of pancreatitis when he was consuming large amounts of alcohol this could be recurrent pancreatitis, there is no right upper quadrant pain to suggest cholecystitis nor is there right lower quadrant pain to suggest appendicitis. He is non-peritoneal and has normal vital signs. Will pursue lipase,  hepatic function, CBC pain control and reevaluate.  Patient reevaluated at 7:00 AM, has no tenderness on palpation, states that his pain is completely resolved after one dose of medication. His laboratory workup has revealed a normal white blood cell count at 7200, no anemia or thrombocytopenia, normal renal function, liver function and a normal lipase. Patient appears stable for discharge, strict followup instructions given along with resource list of phone numbers.  Change of shift - care signed out to Dr. Ignacia Palma pending UA - lab states they just received the sample.  Discharge Prescriptions include: Naprosyn   Vida Roller, MD 04/24/12 1610  Vida Roller, MD 04/24/12 [phone]

## 2012-04-24 NOTE — ED Notes (Signed)
Encouraged to give urine sample.

## 2013-03-05 ENCOUNTER — Emergency Department (HOSPITAL_COMMUNITY): Payer: Medicaid Other

## 2013-03-05 ENCOUNTER — Other Ambulatory Visit: Payer: Self-pay

## 2013-03-05 ENCOUNTER — Emergency Department (HOSPITAL_COMMUNITY)
Admission: EM | Admit: 2013-03-05 | Discharge: 2013-03-05 | Disposition: A | Discharge status: Home / Self Care | Payer: Medicaid Other | Attending: Emergency Medicine | Admitting: Emergency Medicine

## 2013-03-05 DIAGNOSIS — F17200 Nicotine dependence, unspecified, uncomplicated: Secondary | ICD-10-CM | POA: Insufficient documentation

## 2013-03-05 DIAGNOSIS — J209 Acute bronchitis, unspecified: Secondary | ICD-10-CM | POA: Insufficient documentation

## 2013-03-05 LAB — POCT I-STAT TROPONIN I: Troponin i, poc: 0.05 ng/mL (ref 0.00–0.08)

## 2013-03-05 MED ORDER — ALBUTEROL SULFATE HFA 108 (90 BASE) MCG/ACT IN AERS: 1.0000 | INHALATION_SPRAY | RESPIRATORY_TRACT | Status: DC | PRN

## 2013-03-05 NOTE — ED Notes (Signed)
Epic down time--see paper chart 

## 2013-03-11 MED FILL — Acetaminophen Tab 325 MG: ORAL | Qty: 1 | Status: AC

## 2013-03-11 MED FILL — Ipratropium Bromide Inhal Soln 0.02%: RESPIRATORY_TRACT | Qty: 2.5 | Status: AC

## 2013-03-11 MED FILL — Albuterol Sulfate Soln Nebu 0.5% (5 MG/ML): RESPIRATORY_TRACT | Qty: 1 | Status: AC

## 2014-05-06 ENCOUNTER — Emergency Department (HOSPITAL_COMMUNITY)
Admission: EM | Admit: 2014-05-06 | Discharge: 2014-05-06 | Disposition: A | Discharge status: Home / Self Care | Payer: Medicaid Other | Attending: Emergency Medicine | Admitting: Emergency Medicine

## 2014-05-06 DIAGNOSIS — R109 Unspecified abdominal pain: Secondary | ICD-10-CM | POA: Diagnosis present

## 2014-05-06 DIAGNOSIS — F17200 Nicotine dependence, unspecified, uncomplicated: Secondary | ICD-10-CM | POA: Insufficient documentation

## 2014-05-06 DIAGNOSIS — R110 Nausea: Secondary | ICD-10-CM | POA: Diagnosis not present

## 2014-05-06 DIAGNOSIS — Z8719 Personal history of other diseases of the digestive system: Secondary | ICD-10-CM | POA: Insufficient documentation

## 2014-05-06 LAB — CBC WITH DIFFERENTIAL/PLATELET
Basophils Absolute: 0.1 10*3/uL (ref 0.0–0.1)
Basophils Relative: 1 % (ref 0–1)
Eosinophils Absolute: 0.6 10*3/uL (ref 0.0–0.7)
Eosinophils Relative: 8 % — ABNORMAL HIGH (ref 0–5)
HCT: 45.2 % (ref 39.0–52.0)
Hemoglobin: 15 g/dL (ref 13.0–17.0)
Lymphocytes Relative: 35 % (ref 12–46)
Lymphs Abs: 2.4 10*3/uL (ref 0.7–4.0)
MCH: 30.5 pg (ref 26.0–34.0)
MCHC: 33.2 g/dL (ref 30.0–36.0)
MCV: 92.1 fL (ref 78.0–100.0)
Monocytes Absolute: 0.5 10*3/uL (ref 0.1–1.0)
Monocytes Relative: 7 % (ref 3–12)
Neutro Abs: 3.4 10*3/uL (ref 1.7–7.7)
Neutrophils Relative %: 49 % (ref 43–77)
Platelets: 232 10*3/uL (ref 150–400)
RBC: 4.91 MIL/uL (ref 4.22–5.81)
RDW: 13.3 % (ref 11.5–15.5)
WBC: 6.9 10*3/uL (ref 4.0–10.5)

## 2014-05-06 LAB — COMPREHENSIVE METABOLIC PANEL
ALT: 11 U/L (ref 0–53)
AST: 14 U/L (ref 0–37)
Albumin: 3.4 g/dL — ABNORMAL LOW (ref 3.5–5.2)
Alkaline Phosphatase: 66 U/L (ref 39–117)
Anion gap: 8 (ref 5–15)
BUN: 9 mg/dL (ref 6–23)
CO2: 29 mEq/L (ref 19–32)
Calcium: 8.8 mg/dL (ref 8.4–10.5)
Chloride: 104 mEq/L (ref 96–112)
Creatinine, Ser: 1.04 mg/dL (ref 0.50–1.35)
GFR calc Af Amer: >90 mL/min (ref >90)
GFR calc non Af Amer: 88 mL/min — ABNORMAL LOW (ref >90)
Glucose, Bld: 95 mg/dL (ref 70–99)
Potassium: 4.3 mEq/L (ref 3.7–5.3)
Sodium: 141 mEq/L (ref 137–147)
Total Bilirubin: 0.2 mg/dL — ABNORMAL LOW (ref 0.3–1.2)
Total Protein: 5.8 g/dL — ABNORMAL LOW (ref 6.0–8.3)

## 2014-05-06 LAB — LIPASE, BLOOD: Lipase: 25 U/L (ref 11–59)

## 2014-05-06 MED ORDER — MORPHINE SULFATE 4 MG/ML IJ SOLN
4.0000 mg | Freq: Once | INTRAMUSCULAR | Status: DC
  Filled 2014-05-06: qty 1

## 2014-05-06 MED ORDER — ONDANSETRON HCL 4 MG/2ML IJ SOLN
4.0000 mg | Freq: Once | INTRAMUSCULAR | Status: AC
  Administered 2014-05-06: 4 mg via INTRAVENOUS
  Filled 2014-05-06: qty 2

## 2014-05-06 MED ORDER — SODIUM CHLORIDE 0.9 % IV BOLUS (SEPSIS)
1000.0000 mL | Freq: Once | INTRAVENOUS | Status: AC
  Administered 2014-05-06: 1000 mL via INTRAVENOUS

## 2014-05-06 MED ORDER — GI COCKTAIL ~~LOC~~
30.0000 mL | Freq: Once | ORAL | Status: AC
  Administered 2014-05-06: 30 mL via ORAL
  Filled 2014-05-06: qty 30

## 2014-05-06 NOTE — Discharge Instructions (Signed)
Nausea, Adult  Nausea is the feeling that you have an upset stomach or have to vomit. Nausea by itself is not likely a serious concern, but it may be an early sign of more serious medical problems. As nausea gets worse, it can lead to vomiting. If vomiting develops, there is the risk of dehydration.   CAUSES    Viral infections.   Food poisoning.   Medicines.   Pregnancy.   Motion sickness.   Migraine headaches.   Emotional distress.   Severe pain from any source.   Alcohol intoxication.  HOME CARE INSTRUCTIONS   Get plenty of rest.   Ask your caregiver about specific rehydration instructions.   Eat small amounts of food and sip liquids more often.   Take all medicines as told by your caregiver.  SEEK MEDICAL CARE IF:   You have not improved after 2 days, or you get worse.   You have a headache.  SEEK IMMEDIATE MEDICAL CARE IF:    You have a fever.   You faint.   You keep vomiting or have blood in your vomit.   You are extremely weak or dehydrated.   You have dark or bloody stools.   You have severe chest or abdominal pain.  MAKE SURE YOU:   Understand these instructions.   Will watch your condition.   Will get help right away if you are not doing well or get worse.  Document Released: 10/19/2004 Document Revised: 06/05/2012 Document Reviewed: 05/24/2011  ExitCare Patient Information 2015 [employer]. This information is not intended to replace advice given to you by your health care provider. Make sure you discuss any questions you have with your health care provider.  Abdominal Pain  Many things can cause abdominal pain. Usually, abdominal pain is not caused by a disease and will improve without treatment. It can often be observed and treated at home. Your health care provider will do a physical exam and possibly order blood tests and X-rays to help determine the seriousness of your pain. However, in many cases, more time must pass before a clear cause of the pain can be found. Before  that point, your health care provider may not know if you need more testing or further treatment.  HOME CARE INSTRUCTIONS   Monitor your abdominal pain for any changes. The following actions may help to alleviate any discomfort you are experiencing:   Only take over-the-counter or prescription medicines as directed by your health care provider.   Do not take laxatives unless directed to do so by your health care provider.   Try a clear liquid diet (broth, tea, or water) as directed by your health care provider. Slowly move to a bland diet as tolerated.  SEEK MEDICAL CARE IF:   You have unexplained abdominal pain.   You have abdominal pain associated with nausea or diarrhea.   You have pain when you urinate or have a bowel movement.   You experience abdominal pain that wakes you in the night.   You have abdominal pain that is worsened or improved by eating food.   You have abdominal pain that is worsened with eating fatty foods.   You have a fever.  SEEK IMMEDIATE MEDICAL CARE IF:    Your pain does not go away within 2 hours.   You keep throwing up (vomiting).   Your pain is felt only in portions of the abdomen, such as the right side or the left lower portion of the abdomen.     You pass bloody or black tarry stools.  MAKE SURE YOU:   Understand these instructions.    Will watch your condition.    Will get help right away if you are not doing well or get worse.   Document Released: 06/21/2005 Document Revised: 09/16/2013 Document Reviewed: 05/21/2013  [employer] Patient Information 2015 [employer]. This information is not intended to replace advice given to you by your health care provider. Make sure you discuss any questions you have with your health care provider.

## 2014-05-06 NOTE — ED Provider Notes (Signed)
CSN: [id number]     Arrival date & time 05/06/14  0217 History   First MD Initiated Contact with Patient 05/06/14 0606     Chief Complaint  Patient presents with  . Abdominal Pain     (Consider location/radiation/quality/duration/timing/severity/associated sxs/prior Treatment) HPI Comments:  Patient presents to the emergency department with chief complaint of upper abdominal pain. He states pain started yesterday. He reports eating some "bad [place] food." He denies any other past medical history. He has not taken anything to alleviate his symptoms. He denies any vomiting, or diarrhea, but does endorse nausea. Denies any fevers, chills, chest pain, or shortness of breath. Symptoms do not radiate. States his pain is moderate. There no aggravating or alleviating factors.  The history is provided by the patient. No language interpreter was used.    No past medical history on file. No past surgical history on file. No family history on file. History  Substance Use Topics  . Smoking status: Current Every Day Smoker  . Smokeless tobacco: Not on file  . Alcohol Use: No    Review of Systems  All other systems reviewed and are negative.     Allergies  Review of patient's allergies indicates no known allergies.  Home Medications   Prior to Admission medications   Not on File   BP 112/80  Pulse 49  SpO2 100% Physical Exam  Nursing note and vitals reviewed. Constitutional: He is oriented to person, place, and time. He appears well-developed and well-nourished.  HENT:  Head: Normocephalic and atraumatic.  Eyes: Conjunctivae and EOM are normal. Pupils are equal, round, and reactive to light. Right eye exhibits no discharge. Left eye exhibits no discharge. No scleral icterus.  Neck: Normal range of motion. Neck supple. No JVD present.  Cardiovascular: Normal rate, regular rhythm and normal heart sounds.  Exam reveals no gallop and no friction rub.   No murmur  heard. Pulmonary/Chest: Effort normal and breath sounds normal. No respiratory distress. He has no wheezes. He has no rales. He exhibits no tenderness.  Abdominal: Soft. He exhibits no distension and no mass. There is no tenderness. There is no rebound and no guarding.  No focal abdominal tenderness, no RLQ tenderness or pain at McBurney's point, no RUQ tenderness or Murphy's sign, no left-sided abdominal tenderness, no fluid wave, or signs of peritonitis   Musculoskeletal: Normal range of motion. He exhibits no edema and no tenderness.  Neurological: He is alert and oriented to person, place, and time.  Skin: Skin is warm and dry.  Psychiatric: He has a normal mood and affect. His behavior is normal. Judgment and thought content normal.    ED Course  Procedures (including critical care time) Results for orders placed during the hospital encounter of 05/06/14  CBC WITH DIFFERENTIAL      Result Value Ref Range   WBC 6.9  4.0 - 10.5 K/uL   RBC 4.91  4.22 - 5.81 MIL/uL   Hemoglobin 15.0  13.0 - 17.0 g/dL   HCT 82.945.2  56.239.0 - 13.052.0 %   MCV 92.1  78.0 - 100.0 fL   MCH 30.5  26.0 - 34.0 pg   MCHC 33.2  30.0 - 36.0 g/dL   RDW 86.513.3  78.411.5 - 69.615.5 %   Platelets 232  150 - 400 K/uL   Neutrophils Relative % 49  43 - 77 %   Neutro Abs 3.4  1.7 - 7.7 K/uL   Lymphocytes Relative 35  12 - 46 %  Lymphs Abs 2.4  0.7 - 4.0 K/uL   Monocytes Relative 7  3 - 12 %   Monocytes Absolute 0.5  0.1 - 1.0 K/uL   Eosinophils Relative 8 (*) 0 - 5 %   Eosinophils Absolute 0.6  0.0 - 0.7 K/uL   Basophils Relative 1  0 - 1 %   Basophils Absolute 0.1  0.0 - 0.1 K/uL  COMPREHENSIVE METABOLIC PANEL      Result Value Ref Range   Sodium 141  137 - 147 mEq/L   Potassium 4.3  3.7 - 5.3 mEq/L   Chloride 104  96 - 112 mEq/L   CO2 29  19 - 32 mEq/L   Glucose, Bld 95  70 - 99 mg/dL   BUN 9  6 - 23 mg/dL   Creatinine, Ser 1.61  0.50 - 1.35 mg/dL   Calcium 8.8  8.4 - 09.6 mg/dL   Total Protein 5.8 (*) 6.0 - 8.3 g/dL    Albumin 3.4 (*) 3.5 - 5.2 g/dL   AST 14  0 - 37 U/L   ALT 11  0 - 53 U/L   Alkaline Phosphatase 66  39 - 117 U/L   Total Bilirubin 0.2 (*) 0.3 - 1.2 mg/dL   GFR calc non Af Amer 88 (*) >90 mL/min   GFR calc Af Amer >90  >90 mL/min   Anion gap 8  5 - 15  LIPASE, BLOOD      Result Value Ref Range   Lipase 25  11 - 59 U/L   No results found.   Imaging Review No results found.   EKG Interpretation None      MDM   Final diagnoses:  Nausea    Patient with minimal abdominal discomfort, likely due to to a last oral intake. Will treat symptoms in the ED with fluids, Zofran, and pain medicine. Will check basic labs. Patient does have a history of pancreatitis, but states that this does not feel as bad as it. He looks well, and is not in any apparent distress.  Patient is feeling much better after fluids, Zofran, and GI cocktail. Discharged to home. Abdomen soft nontender. No evidence of acute or surgical abdomen. Return precautions given.  Patient instructed to return for:  New or worsening symptoms, including, increased abdominal pain, especially pain that localizes to one side, bloody vomit, bloody diarrhea, fever >101, and intractable vomiting.      Roxy Horseman, PA-C 05/06/14 [phone]

## 2014-05-13 NOTE — ED Provider Notes (Signed)
Medical screening examination/treatment/procedure(s) were performed by non-physician practitioner and as supervising physician I was immediately available for consultation/collaboration.   EKG Interpretation None        Perfecto Purdy, MD 05/13/14 0135 

## 2014-11-13 ENCOUNTER — Emergency Department (HOSPITAL_COMMUNITY)
Admission: EM | Admit: 2014-11-13 | Discharge: 2014-11-13 | Disposition: A | Discharge status: Home / Self Care | Payer: Medicaid Other | Attending: Emergency Medicine | Admitting: Emergency Medicine

## 2014-11-13 ENCOUNTER — Emergency Department (HOSPITAL_COMMUNITY): Payer: Medicaid Other

## 2014-11-13 ENCOUNTER — Encounter (HOSPITAL_COMMUNITY): Payer: Self-pay | Admitting: Emergency Medicine

## 2014-11-13 DIAGNOSIS — M549 Dorsalgia, unspecified: Secondary | ICD-10-CM

## 2014-11-13 DIAGNOSIS — M545 Low back pain: Secondary | ICD-10-CM | POA: Diagnosis present

## 2014-11-13 DIAGNOSIS — M5442 Lumbago with sciatica, left side: Secondary | ICD-10-CM | POA: Diagnosis not present

## 2014-11-13 DIAGNOSIS — Z720 Tobacco use: Secondary | ICD-10-CM | POA: Insufficient documentation

## 2014-11-13 MED ORDER — CYCLOBENZAPRINE HCL 10 MG PO TABS: 5.0000 mg | ORAL_TABLET | Freq: Two times a day (BID) | ORAL | Status: DC | PRN

## 2014-11-13 MED ORDER — NAPROXEN 375 MG PO TABS: 375.0000 mg | ORAL_TABLET | Freq: Two times a day (BID) | ORAL | Status: DC

## 2014-11-13 NOTE — ED Provider Notes (Signed)
CSN: [id number]     Arrival date & time 11/13/14  1101 History  This chart was scribed for Keith MuttonMarissa Janeann Paisley, PA-C, working with American Express[employer] R. Rubin PayorPickering, MD by Chestine SporeSoijett Blue, ED Scribe. The patient was seen in room TR08C/TR08C at 11:16 AM.    Chief Complaint  Patient presents with  . Back Pain    The history is provided by the patient. No language interpreter was used.    HPI Comments: Tonette LedererStephen Green is a 39 y.o. male with no known significant PMHx who presents to the Emergency Department complaining of low back pain onset 5 days ago after sleeping on the sofa the night before.  He describes the pain as a constant, sharp, shooting sensation. Pt notes that the pain does radiate to his left leg and the pain is worsened with movement and that the pain also sometimes shoots to his left shoulder with movement. He reports that his pain is worsened with walking and that there is a pulling sensation while trying to stand from a sitting position. He has not tried any medications for the relief of his symptoms. He denies numbness, tingling, loss of sensation, dysuria, abdominal pain, CP, SOB, difficulty breathing, bladder and bowel incontinence, muscle spasms, fall, injury, heavy lifting, and any other associated symptoms. Pt denies hx of back pain in the past.  PCP is Keith Green.  History reviewed. No pertinent past medical history. History reviewed. No pertinent past surgical history. No family history on file. History  Substance Use Topics  . Smoking status: Current Every Day Smoker  . Smokeless tobacco: Not on file  . Alcohol Use: No    Review of Systems  Respiratory: Negative for shortness of breath.   Cardiovascular: Negative for chest pain.  Gastrointestinal: Negative for abdominal pain, diarrhea and constipation.  Genitourinary: Negative for dysuria, urgency, frequency and difficulty urinating.  Musculoskeletal: Positive for back pain.  Neurological: Negative for dizziness, weakness and numbness.       Allergies  Review of patient's allergies indicates no known allergies.  Home Medications   Prior to Admission medications   Medication Sig Start Date End Date Taking? Authorizing Provider  cyclobenzaprine (FLEXERIL) 10 MG tablet Take 0.5 tablets (5 mg total) by mouth 2 (two) times daily as needed for muscle spasms. 11/13/14   Keith Horsley, PA-C  naproxen (NAPROSYN) 375 MG tablet Take 1 tablet (375 mg total) by mouth 2 (two) times daily. 11/13/14   Keith Burkemper, PA-C   BP 130/82 mmHg  Pulse 79  Temp(Src) 98 F (36.7 C) (Oral)  Resp 20  SpO2 99%  Physical Exam  Constitutional: He is oriented to person, place, and time. He appears well-developed and well-nourished. No distress.  HENT:  Head: Normocephalic and atraumatic.  Mouth/Throat: Oropharynx is clear and moist. No oropharyngeal exudate.  Eyes: Conjunctivae and EOM are normal. Pupils are equal, round, and reactive to light. Right eye exhibits no discharge. Left eye exhibits no discharge.  Neck: Normal range of motion. Neck supple.  Cardiovascular: Normal rate, regular rhythm and normal heart sounds.  Exam reveals no friction rub.   No murmur heard. Pulses:      Radial pulses are 2+ on the right side, and 2+ on the left side.       Dorsalis pedis pulses are 2+ on the right side, and 2+ on the left side.  Pulmonary/Chest: Effort normal and breath sounds normal. No respiratory distress. He has no wheezes. He has no rales.  Musculoskeletal: Normal range of motion.  Lumbar back: He exhibits tenderness. He exhibits normal range of motion, no bony tenderness, no swelling, no edema, no deformity, no laceration and no pain.       Back:  Neurological: He is alert and oriented to person, place, and time. No cranial nerve deficit. He exhibits normal muscle tone. Coordination normal.  Strength 5+/5+ to upper and lower extremities bilaterally with resistance applied, equal distribution noted Negative saddle paresthesias   Sensation intact with differentiation to sharp and dull touch  Heel to knee down shin normal bilaterally Gait proper, proper balance - negative sway, negative drift, negative step-offs  Skin: Skin is warm and dry. No rash noted. He is not diaphoretic. No erythema.  Psychiatric: He has a normal mood and affect. His behavior is normal. Thought content normal.  Nursing note and vitals reviewed.   ED Course  Procedures (including critical care time) DIAGNOSTIC STUDIES: Oxygen Saturation is 99% on room air, normal by my interpretation.    COORDINATION OF CARE: 11:34 AM-Discussed treatment plan which includes L-spine X-ray with pt at bedside and pt agreed to plan.   Labs Review Labs Reviewed - No data to display  Imaging Review Dg Lumbar Spine Complete  11/13/2014   CLINICAL DATA:  Low back pain for several days. No injury. Pain radiates to LEFT leg.  EXAM: LUMBAR SPINE - COMPLETE 4+ VIEW  COMPARISON:  None.  FINDINGS: There is no evidence of lumbar spine fracture. Alignment is normal. Intervertebral disc spaces are maintained.  IMPRESSION: Negative.   Electronically Signed   By: Keith Green M.D.   On: 11/13/2014 13:05     EKG Interpretation None      MDM   Final diagnoses:  Back pain  Bilateral low back pain with left-sided sciatica    Medications - No data to display  Filed Vitals:   11/13/14 1110 11/13/14 1242  BP: 130/82 128/85  Pulse: 79 66  Temp: 98 F (36.7 C)   TempSrc: Oral   Resp: 20   SpO2: 99% 100%   I personally performed the services described in this documentation, which was scribed in my presence. The recorded information has been reviewed and is accurate.   Plain film of lumbar spine negative for acute osseous injury. Patient presenting to the ED with lumbar back pain that started 5 days ago. Negative focal neurological deficits. Pulses palpable and strong. Negative saddle paresthesias. Gait proper with negative ataxia. Doubt cauda equina. Doubt  epidural abscess. Suspicion to be muscular pain - secondary to pain upon palpation and motion. Suspicion to be sciatic pain. Patient stable, afebrile. Patient not septic appearing. Discharged patient. Discharged patient with pain medications and muscle relaxers. Discussed with patient to rest and stay hydrated. Discussed with patient to apply warm compressions and massage. Referred to PCP and orthopedics. Discussed with patient to closely monitor symptoms and if symptoms are to worsen or change to report back to the ED - strict return instructions given.  Patient agreed to plan of care, understood, all questions answered.     Keith Mutton, PA-C 11/13/14 [address] PA-C 11/13/14 1321  Juliet Rude. Rubin Payor, MD 11/17/14 [phone]

## 2014-11-13 NOTE — ED Notes (Addendum)
C/o LBP radiating to left leg, worse with movement. States it sometimes "shoots up" to his left shoulder with movement. States pain started 1 week ago after sleeping on the sofa.

## 2014-11-13 NOTE — Discharge Instructions (Signed)
Please call your doctor for a followup appointment within 24-48 hours. When you talk to your doctor please let them know that you were seen in the emergency department and have them acquire all of your records so that they can discuss the findings with you and formulate a treatment plan to fully care for your new and ongoing problems. Please follow-up with orthopedics regarding back pain Please take medications as prescribed and on a full stomach. Naproxen as an anti-inflammatory, please take on a full stomach for this can lead to stomach upset and stomach bleeds. Flexeril as a muscle relaxer this can cause drowsiness-please do not take with alcohol, while driving, while operating any heavy machinery. Please apply warm compressions and massage Please avoid any heavy or strenuous activity Please continue to monitor symptoms closely and if symptoms are to worsen or change (fever greater than 101, chills, sweating, nausea, vomiting, chest pain, shortness of breathe, difficulty breathing, weakness, numbness, tingling, worsening or changes to pain pattern, fall, injury, loss of sensation, inability to control urine or bowel movements) please report back to the Emergency Department immediately.   Back Pain, Adult Low back pain is very common. About 1 in 5 people have back pain.The cause of low back pain is rarely dangerous. The pain often gets better over time.About half of people with a sudden onset of back pain feel better in just 2 weeks. About 8 in 10 people feel better by 6 weeks.  CAUSES Some common causes of back pain include:  Strain of the muscles or ligaments supporting the spine.  Wear and tear (degeneration) of the spinal discs.  Arthritis.  Direct injury to the back. DIAGNOSIS Most of the time, the direct cause of low back pain is not known.However, back pain can be treated effectively even when the exact cause of the pain is unknown.Answering your caregiver's questions about your  overall health and symptoms is one of the most accurate ways to make sure the cause of your pain is not dangerous. If your caregiver needs more information, he or she may order lab work or imaging tests (X-rays or MRIs).However, even if imaging tests show changes in your back, this usually does not require surgery. HOME CARE INSTRUCTIONS For many people, back pain returns.Since low back pain is rarely dangerous, it is often a condition that people can learn to [facility] their own.   Remain active. It is stressful on the back to sit or stand in one place. Do not sit, drive, or stand in one place for more than 30 minutes at a time. Take short walks on level surfaces as soon as pain allows.Try to increase the length of time you walk each day.  Do not stay in bed.Resting more than 1 or 2 days can delay your recovery.  Do not avoid exercise or work.Your body is made to move.It is not dangerous to be active, even though your back may hurt.Your back will likely heal faster if you return to being active before your pain is gone.  Pay attention to your body when you bend and lift. Many people have less discomfortwhen lifting if they bend their knees, keep the load close to their bodies,and avoid twisting. Often, the most comfortable positions are those that put less stress on your recovering back.  Find a comfortable position to sleep. Use a firm mattress and lie on your side with your knees slightly bent. If you lie on your back, put a pillow under your knees.  Only take over-the-counter  or prescription medicines as directed by your caregiver. Over-the-counter medicines to reduce pain and inflammation are often the most helpful.Your caregiver may prescribe muscle relaxant drugs.These medicines help dull your pain so you can more quickly return to your normal activities and healthy exercise.  Put ice on the injured area.  Put ice in a plastic bag.  Place a towel between your skin and the  bag.  Leave the ice on for 15-20 minutes, 03-04 times a day for the first 2 to 3 days. After that, ice and heat may be alternated to reduce pain and spasms.  Ask your caregiver about trying back exercises and gentle massage. This may be of some benefit.  Avoid feeling anxious or stressed.Stress increases muscle tension and can worsen back pain.It is important to recognize when you are anxious or stressed and learn ways to manage it.Exercise is a great option. SEEK MEDICAL CARE IF:  You have pain that is not relieved with rest or medicine.  You have pain that does not improve in 1 week.  You have new symptoms.  You are generally not feeling well. SEEK IMMEDIATE MEDICAL CARE IF:   You have pain that radiates from your back into your legs.  You develop new bowel or bladder control problems.  You have unusual weakness or numbness in your arms or legs.  You develop nausea or vomiting.  You develop abdominal pain.  You feel faint. Document Released: 09/11/2005 Document Revised: 03/12/2012 Document Reviewed: 01/13/2014 [facility] Patient Information 2015 [place], [place]. This information is not intended to replace advice given to you by your health care provider. Make sure you discuss any questions you have with your health care provider.  Sciatica Sciatica is pain, weakness, numbness, or tingling along the path of the sciatic nerve. The nerve starts in the lower back and runs down the back of each leg. The nerve controls the muscles in the lower leg and in the back of the knee, while also providing sensation to the back of the thigh, lower leg, and the sole of your foot. Sciatica is a symptom of another medical condition. For instance, nerve damage or certain conditions, such as a herniated disk or bone spur on the spine, pinch or put pressure on the sciatic nerve. This causes the pain, weakness, or other sensations normally associated with sciatica. Generally, sciatica only affects one  side of the body. CAUSES   Herniated or slipped disc.  Degenerative disk disease.  A pain disorder involving the narrow muscle in the buttocks (piriformis syndrome).  Pelvic injury or fracture.  Pregnancy.  Tumor (rare). SYMPTOMS  Symptoms can vary from mild to very severe. The symptoms usually travel from the low back to the buttocks and down the back of the leg. Symptoms can include:  Mild tingling or dull aches in the lower back, leg, or hip.  Numbness in the back of the calf or sole of the foot.  Burning sensations in the lower back, leg, or hip.  Sharp pains in the lower back, leg, or hip.  Leg weakness.  Severe back pain inhibiting movement. These symptoms may get worse with coughing, sneezing, laughing, or prolonged sitting or standing. Also, being overweight may worsen symptoms. DIAGNOSIS  Your caregiver will perform a physical exam to look for common symptoms of sciatica. He or she may ask you to do certain movements or activities that would trigger sciatic nerve pain. Other tests may be performed to find the cause of the sciatica. These may include:  Blood tests.  X-rays.  Imaging tests, such as an MRI or CT scan. TREATMENT  Treatment is directed at the cause of the sciatic pain. Sometimes, treatment is not necessary and the pain and discomfort goes away on its own. If treatment is needed, your caregiver may suggest:  Over-the-counter medicines to relieve pain.  Prescription medicines, such as anti-inflammatory medicine, muscle relaxants, or narcotics.  Applying heat or ice to the painful area.  Steroid injections to lessen pain, irritation, and inflammation around the nerve.  Reducing activity during periods of pain.  Exercising and stretching to strengthen your abdomen and improve flexibility of your spine. Your caregiver may suggest losing weight if the extra weight makes the back pain worse.  Physical therapy.  Surgery to eliminate what is pressing  or pinching the nerve, such as a bone spur or part of a herniated disk. HOME CARE INSTRUCTIONS   Only take over-the-counter or prescription medicines for pain or discomfort as directed by your caregiver.  Apply ice to the affected area for 20 minutes, 3-4 times a day for the first 48-72 hours. Then try heat in the same way.  Exercise, stretch, or perform your usual activities if these do not aggravate your pain.  Attend physical therapy sessions as directed by your caregiver.  Keep all follow-up appointments as directed by your caregiver.  Do not wear high heels or shoes that do not provide proper support.  Check your mattress to see if it is too soft. A firm mattress may lessen your pain and discomfort. SEEK IMMEDIATE MEDICAL CARE IF:   You lose control of your bowel or bladder (incontinence).  You have increasing weakness in the lower back, pelvis, buttocks, or legs.  You have redness or swelling of your back.  You have a burning sensation when you urinate.  You have pain that gets worse when you lie down or awakens you at night.  Your pain is worse than you have experienced in the past.  Your pain is lasting longer than 4 weeks.  You are suddenly losing weight without reason. MAKE SURE YOU:  Understand these instructions.  Will watch your condition.  Will get help right away if you are not doing well or get worse. Document Released: 09/05/2001 Document Revised: 03/12/2012 Document Reviewed: 01/21/2012 [facility] Patient Information 2015 [place], [place]. This information is not intended to replace advice given to you by your health care provider. Make sure you discuss any questions you have with your health care provider.

## 2015-11-26 ENCOUNTER — Ambulatory Visit: Payer: Medicaid Other | Admitting: Internal Medicine

## 2015-12-14 ENCOUNTER — Telehealth: Payer: Self-pay

## 2015-12-14 NOTE — Telephone Encounter (Signed)
APPT. REMINDER CALL, NO ANSWER, PHONE DISCONNETED

## 2015-12-15 ENCOUNTER — Encounter: Payer: Self-pay | Admitting: Internal Medicine

## 2015-12-15 ENCOUNTER — Ambulatory Visit (INDEPENDENT_AMBULATORY_CARE_PROVIDER_SITE_OTHER): Payer: Medicaid Other | Admitting: Internal Medicine

## 2015-12-15 VITALS — BP 125/86 | HR 71 | Temp 98.8°F | Resp 18 | Ht 71.0 in | Wt 206.0 lb

## 2015-12-15 DIAGNOSIS — G8929 Other chronic pain: Secondary | ICD-10-CM

## 2015-12-15 DIAGNOSIS — M25511 Pain in right shoulder: Secondary | ICD-10-CM | POA: Diagnosis not present

## 2015-12-15 DIAGNOSIS — E663 Overweight: Secondary | ICD-10-CM | POA: Diagnosis not present

## 2015-12-15 DIAGNOSIS — F17210 Nicotine dependence, cigarettes, uncomplicated: Secondary | ICD-10-CM | POA: Diagnosis not present

## 2015-12-15 DIAGNOSIS — J309 Allergic rhinitis, unspecified: Secondary | ICD-10-CM | POA: Diagnosis not present

## 2015-12-15 DIAGNOSIS — E785 Hyperlipidemia, unspecified: Secondary | ICD-10-CM | POA: Diagnosis present

## 2015-12-15 DIAGNOSIS — R440 Auditory hallucinations: Secondary | ICD-10-CM | POA: Insufficient documentation

## 2015-12-15 DIAGNOSIS — Z6828 Body mass index (BMI) 28.0-28.9, adult: Secondary | ICD-10-CM

## 2015-12-15 DIAGNOSIS — R002 Palpitations: Secondary | ICD-10-CM | POA: Insufficient documentation

## 2015-12-15 DIAGNOSIS — R000 Tachycardia, unspecified: Secondary | ICD-10-CM | POA: Diagnosis not present

## 2015-12-15 DIAGNOSIS — F17200 Nicotine dependence, unspecified, uncomplicated: Secondary | ICD-10-CM

## 2015-12-15 DIAGNOSIS — Z0000 Encounter for general adult medical examination without abnormal findings: Secondary | ICD-10-CM

## 2015-12-15 MED ORDER — NICOTINE 14 MG/24HR TD PT24: 14.0000 mg | MEDICATED_PATCH | TRANSDERMAL | Status: DC

## 2015-12-15 MED ORDER — RISPERIDONE 1 MG PO TABS: 1.0000 mg | ORAL_TABLET | Freq: Every day | ORAL | Status: DC

## 2015-12-15 MED ORDER — CETIRIZINE HCL 10 MG PO TABS: 10.0000 mg | ORAL_TABLET | Freq: Every day | ORAL | Status: DC

## 2015-12-15 NOTE — Patient Instructions (Signed)
Take zyrtec for your allergies.  Use the nicotine patch (put fresh patch daily in the morning, take it off before going to bed). Stop smoking.   Take risperidone at night time.

## 2015-12-15 NOTE — Assessment & Plan Note (Signed)
Has auditory hallucination for many years. [facility] Was given Risperdal for this which worked great for him. Has not been taking this. Still hearing voices at night. Not telling him to hurt himself or others.   Will restart risperidal 1mg  qHS.

## 2015-12-15 NOTE — Progress Notes (Signed)
Subjective:    Patient ID: Keith Green, male    DOB: 1976/03/25, 40 y.o.   MRN: [id number]  HPI  40 yo male with no prior medical hx is here to establish care as a new patient.   Denies any prior hx of any medical problem other than right shoulder pain for many years. It hurts to move his shoulder. 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 No pain at rest. Pain worst after waking up in the morning.   Chart review showed he had auditory hallucinations in the past. Was on risperidal. Has not been taking it. It helped in the past when he took it. Still hears voices at night which keeps him awake. Denies suicidal or homicidal thoughts.   Wants help to quit smoking. Has not tried anything in the past.   Has occasional feeling of heart beating fast only when he is really excited or physically exerting himself. No dizziness or other   Has post nasal drip and rhinitis.   FH: mother with HTN. No other family hx including no hx of heart disease, HTN, or cancer.  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 Smokes 0.5 ppd cigarettes, denies alcohol and drug use.   SxH: knee arthroscopy.   Meds: none. Allergy: none.  Review of Systems  Constitutional: Negative for fever, chills and fatigue.  HENT: Positive for postnasal drip and rhinorrhea. Negative for congestion, drooling, sore throat and voice change.   Eyes: Negative for photophobia and visual disturbance.  Respiratory: Positive for cough. Negative for chest tightness, shortness of breath and wheezing.   Cardiovascular: Negative for chest pain, palpitations and leg swelling.  Gastrointestinal: Negative for nausea, abdominal pain, diarrhea, blood in stool and abdominal distention.  Endocrine: Negative.   Genitourinary: Negative for dysuria and hematuria.  Musculoskeletal: Negative for back pain and arthralgias.  Skin: Negative.   Allergic/Immunologic: Negative.   Neurological:  Negative for dizziness, syncope and headaches.  Hematological: Negative.   Psychiatric/Behavioral: Positive for hallucinations. Negative for suicidal ideas, confusion, sleep disturbance, self-injury, dysphoric mood and agitation. The patient is not nervous/anxious and is not hyperactive.        Objective:   Physical Exam  Constitutional: He is oriented to person, place, and time. He appears well-developed and well-nourished. No distress.  HENT:  Head: Normocephalic and atraumatic.  Eyes: Conjunctivae and EOM are normal. Pupils are equal, round, and reactive to light.  Neck: Normal range of motion. No JVD present.  Cardiovascular: Normal rate and regular rhythm.  Exam reveals no gallop and no friction rub.   No murmur heard. Pulmonary/Chest: Effort normal and breath sounds normal. No respiratory distress. He has no wheezes.  Abdominal: Soft. Bowel sounds are normal. He exhibits no distension. There is no tenderness.  Musculoskeletal: Normal range of motion. He exhibits no edema or tenderness.  Has some mild tenderness on his right trap muscle. Has full ROM on exam. Some pain with movement of his right shoulder but no pain without movement. No joint deformities. No spinous process tenderness.  Lymphadenopathy:    He has no cervical adenopathy.  Neurological: He is alert and oriented to person, place, and time. No cranial nerve deficit. Coordination normal.  Skin: He is not diaphoretic.  Psychiatric: He has a normal mood and affect. Judgment and thought content normal.     Filed Vitals:   12/15/15 0834 12/15/15 0914  BP: 156/88 125/86  Pulse: 78 71  Temp: 98.8 F (37.1 C)   Resp: 18         Assessment & Plan:  See problem based a&p.

## 2015-12-15 NOTE — Assessment & Plan Note (Signed)
Has post nasal drip and rhinorrhea.  Will do trial of zyrtec daily.

## 2015-12-15 NOTE — Assessment & Plan Note (Signed)
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 Has good ROM on exam. Has mild tenderness on exam, mild pain with movement. This may be due to muscle stiffness vs mild tendonitis from previous lifting injury. Asked him to take NSAID but he is afraid of the side effects. I asked him to take tylenol PRN for pain control. If continues to bother him, may refer him to sports medicine. The pain is mainly periarticular, so I don't think shoulder injection will help.

## 2015-12-15 NOTE — Assessment & Plan Note (Signed)
Smokes 0.5 ppd currently. Wants to quit. Has not tried anything in the past. I don't feel safe giving him Chantix with his auditory hallucinations.  Will do Nicotine patch.

## 2015-12-15 NOTE — Assessment & Plan Note (Signed)
He is over weight and over 33. Will check lipid panel.

## 2015-12-15 NOTE — Assessment & Plan Note (Signed)
Has occasional feeling of heart racing when he is very excited or physically exerting himself. Never had chest pain, syncope, or dizziness.  On my exam he has regular rhythm and normal pulse. I think he may just have sinus tachycardia when he is physically or emotionally active. I will not do any further workup for this unless he is symptomatic in the future.

## 2015-12-16 DIAGNOSIS — E785 Hyperlipidemia, unspecified: Secondary | ICD-10-CM | POA: Insufficient documentation

## 2015-12-16 LAB — LIPID PANEL
Chol/HDL Ratio: 4.4 ratio units (ref 0.0–5.0)
Cholesterol, Total: 202 mg/dL — ABNORMAL HIGH (ref 100–199)
HDL: 46 mg/dL (ref >39)
LDL Calculated: 140 mg/dL — ABNORMAL HIGH (ref 0–99)
Triglycerides: 78 mg/dL (ref 0–149)
VLDL Cholesterol Cal: 16 mg/dL (ref 5–40)

## 2015-12-16 NOTE — Progress Notes (Signed)
Internal Medicine Clinic Attending  Case discussed with Dr. Ahmed at the time of the visit.  We reviewed the resident's history and exam and pertinent patient test results.  I agree with the assessment, diagnosis, and plan of care documented in the resident's note. 

## 2015-12-16 NOTE — Assessment & Plan Note (Signed)
Lipid Panel     Component Value Date/Time   CHOL 202* 12/15/2015 0932   TRIG 78 12/15/2015 0932   HDL 46 12/15/2015 0932   CHOLHDL 4.4 12/15/2015 0932   LDLCALC 140* 12/15/2015 0932    ASCVD 10 year risk  5.5%. Based on guideline he would qualify for moderate intensity statin. I will focus on changing his smoking habit for now and that can lower the risk to less than 5% which would mean at that point would not require statin. Also will work on life style changes for now.

## 2015-12-16 NOTE — Addendum Note (Signed)
Addended by: Hyacinth MeekerAHMED, Lorena Clearman on: 12/16/2015 09:43 AM   Modules accepted: Kipp BroodSmartSet

## 2015-12-31 ENCOUNTER — Encounter: Payer: Self-pay | Admitting: Internal Medicine

## 2016-01-05 ENCOUNTER — Encounter: Payer: Self-pay | Admitting: Internal Medicine

## 2016-01-25 ENCOUNTER — Ambulatory Visit: Payer: Medicaid Other | Admitting: Internal Medicine

## 2016-01-25 ENCOUNTER — Encounter: Payer: Self-pay | Admitting: Internal Medicine

## 2016-01-25 NOTE — Telephone Encounter (Signed)
Called pt, he states he is not wanting to harm self or others today, he is ask to go to ED but he desires appt, scheduled appt for 5/3 at 1015 dr Earnest Conroyflores, he is ask to call 911 if he has feelings of harm to self or others, he was agreeable

## 2016-01-26 ENCOUNTER — Encounter: Payer: Self-pay | Admitting: Internal Medicine

## 2016-01-26 ENCOUNTER — Ambulatory Visit: Payer: Medicaid Other | Admitting: Internal Medicine

## 2016-01-27 ENCOUNTER — Emergency Department (HOSPITAL_COMMUNITY): Payer: Medicaid Other

## 2016-01-27 ENCOUNTER — Encounter (HOSPITAL_COMMUNITY): Payer: Self-pay | Admitting: *Deleted

## 2016-01-27 ENCOUNTER — Emergency Department (HOSPITAL_COMMUNITY)
Admission: EM | Admit: 2016-01-27 | Discharge: 2016-01-27 | Disposition: A | Discharge status: Home / Self Care | Payer: Medicaid Other | Attending: Emergency Medicine | Admitting: Emergency Medicine

## 2016-01-27 DIAGNOSIS — R079 Chest pain, unspecified: Secondary | ICD-10-CM

## 2016-01-27 DIAGNOSIS — Z79899 Other long term (current) drug therapy: Secondary | ICD-10-CM | POA: Diagnosis not present

## 2016-01-27 DIAGNOSIS — F17210 Nicotine dependence, cigarettes, uncomplicated: Secondary | ICD-10-CM | POA: Insufficient documentation

## 2016-01-27 LAB — BASIC METABOLIC PANEL
Anion gap: 10 (ref 5–15)
BUN: 7 mg/dL (ref 6–20)
CO2: 25 mmol/L (ref 22–32)
Calcium: 9 mg/dL (ref 8.9–10.3)
Chloride: 103 mmol/L (ref 101–111)
Creatinine, Ser: 1.18 mg/dL (ref 0.61–1.24)
GFR calc Af Amer: >60 mL/min (ref >60)
GFR calc non Af Amer: >60 mL/min (ref >60)
Glucose, Bld: 122 mg/dL — ABNORMAL HIGH (ref 65–99)
Potassium: 4 mmol/L (ref 3.5–5.1)
Sodium: 138 mmol/L (ref 135–145)

## 2016-01-27 LAB — CBC
HCT: 42.3 % (ref 39.0–52.0)
Hemoglobin: 14.3 g/dL (ref 13.0–17.0)
MCH: 31 pg (ref 26.0–34.0)
MCHC: 33.8 g/dL (ref 30.0–36.0)
MCV: 91.8 fL (ref 78.0–100.0)
Platelets: 248 10*3/uL (ref 150–400)
RBC: 4.61 MIL/uL (ref 4.22–5.81)
RDW: 12.9 % (ref 11.5–15.5)
WBC: 6 10*3/uL (ref 4.0–10.5)

## 2016-01-27 LAB — TROPONIN I: Troponin I: <0.03 ng/mL (ref <0.031)

## 2016-01-27 LAB — I-STAT TROPONIN, ED: Troponin i, poc: 0 ng/mL (ref 0.00–0.08)

## 2016-01-27 NOTE — ED Notes (Signed)
The pt is c/o mid-chest pain  For 30 minutes with sob.. None now.  The pt is very apprehensive.  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  Diaphoresis with the chest pain when it started.
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
## 2016-01-27 NOTE — Discharge Instructions (Signed)
Follow-up with your primary Dr. in the next week for recheck of your blood pressure. It is elevated in the emergency department today.   Nonspecific Chest Pain  Chest pain can be caused by many different conditions. There is always a chance that your pain could be related to something serious, such as a heart attack or a blood clot in your lungs. Chest pain can also be caused by conditions that are not life-threatening. If you have chest pain, it is very important to follow up with your health care provider. CAUSES  Chest pain can be caused by:  Heartburn.  Pneumonia or bronchitis.  Anxiety or stress.  Inflammation around your heart (pericarditis) or lung (pleuritis or pleurisy).  A blood clot in your lung.  A collapsed lung (pneumothorax). It can develop suddenly on its own (spontaneous pneumothorax) or from trauma to the chest.  Shingles infection (varicella-zoster virus).  Heart attack.  Damage to the bones, muscles, and cartilage that make up your chest wall. This can include:  Bruised bones due to injury.  Strained muscles or cartilage due to frequent or repeated coughing or overwork.  Fracture to one or more ribs.  Sore cartilage due to inflammation (costochondritis). RISK FACTORS  Risk factors for chest pain may include:  Activities that increase your risk for trauma or injury to your chest.  Respiratory infections or conditions that cause frequent coughing.  Medical conditions or overeating that can cause heartburn.  Heart disease or family history of heart disease.  Conditions or health behaviors that increase your risk of developing a blood clot.  Having had chicken pox (varicella zoster). SIGNS AND SYMPTOMS Chest pain can feel like:  Burning or tingling on the surface of your chest or deep in your chest.  Crushing, pressure, aching, or squeezing pain.  Dull or sharp pain that is worse when you move, cough, or take a deep breath.  Pain that is also  felt in your back, neck, shoulder, or arm, or pain that spreads to any of these areas. Your chest pain may come and go, or it may stay constant. DIAGNOSIS Lab tests or other studies may be needed to find the cause of your pain. Your health care provider may have you take a test called an ambulatory ECG (electrocardiogram). An ECG records your heartbeat patterns at the time the test is performed. You may also have other tests, such as:  Transthoracic echocardiogram (TTE). During echocardiography, sound waves are used to create a picture of all of the heart structures and to look at how blood flows through your heart.  Transesophageal echocardiogram (TEE).This is a more advanced imaging test that obtains images from inside your body. It allows your health care provider to see your heart in finer detail.  Cardiac monitoring. This allows your health care provider to monitor your heart rate and rhythm in real time.  Holter monitor. This is a portable device that records your heartbeat and can help to diagnose abnormal heartbeats. It allows your health care provider to track your heart activity for several days, if needed.  Stress tests. These can be done through exercise or by taking medicine that makes your heart beat more quickly.  Blood tests.  Imaging tests. TREATMENT  Your treatment depends on what is causing your chest pain. Treatment may include:  Medicines. These may include:  Acid blockers for heartburn.  Anti-inflammatory medicine.  Pain medicine for inflammatory conditions.  Antibiotic medicine, if an infection is present.  Medicines to dissolve blood clots.  Medicines to treat coronary artery disease.  Supportive care for conditions that do not require medicines. This may include:  Resting.  Applying heat or cold packs to injured areas.  Limiting activities until pain decreases. HOME CARE INSTRUCTIONS  If you were prescribed an antibiotic medicine, finish it all  even if you start to feel better.  Avoid any activities that bring on chest pain.  Do not use any tobacco products, including cigarettes, chewing tobacco, or electronic cigarettes. If you need help quitting, ask your health care provider.  Do not drink alcohol.  Take medicines only as directed by your health care provider.  Keep all follow-up visits as directed by your health care provider. This is important. This includes any further testing if your chest pain does not go away.  If heartburn is the cause for your chest pain, you may be told to keep your head raised (elevated) while sleeping. This reduces the chance that acid will go from your stomach into your esophagus.  Make lifestyle changes as directed by your health care provider. These may include:  Getting regular exercise. Ask your health care provider to suggest some activities that are safe for you.  Eating a heart-healthy diet. A registered dietitian can help you to learn healthy eating options.  Maintaining a healthy weight.  Managing diabetes, if necessary.  Reducing stress. SEEK MEDICAL CARE IF:  Your chest pain does not go away after treatment.  You have a rash with blisters on your chest.  You have a fever. SEEK IMMEDIATE MEDICAL CARE IF:   Your chest pain is worse.  You have an increasing cough, or you cough up blood.  You have severe abdominal pain.  You have severe weakness.  You faint.  You have chills.  You have sudden, unexplained chest discomfort.  You have sudden, unexplained discomfort in your arms, back, neck, or jaw.  You have shortness of breath at any time.  You suddenly start to sweat, or your skin gets clammy.  You feel nauseous or you vomit.  You suddenly feel light-headed or dizzy.  Your heart begins to beat quickly, or it feels like it is skipping beats. These symptoms may represent a serious problem that is an emergency. Do not wait to see if the symptoms will go away. Get  medical help right away. Call your local emergency services (911 in the [place] Do not drive yourself to the hospital.   This information is not intended to replace advice given to you by your health care provider. Make sure you discuss any questions you have with your health care provider.   Document Released: 06/21/2005 Document Revised: 10/02/2014 Document Reviewed: 04/17/2014 Elsevier Interactive Patient Education [employer]

## 2016-03-24 ENCOUNTER — Encounter: Payer: Medicaid Other | Admitting: Internal Medicine

## 2016-04-25 DIAGNOSIS — R079 Chest pain, unspecified: Secondary | ICD-10-CM

## 2016-04-25 HISTORY — DX: Chest pain, unspecified: R07.9

## 2016-05-11 ENCOUNTER — Telehealth: Payer: Self-pay | Admitting: Internal Medicine

## 2016-05-11 NOTE — Telephone Encounter (Signed)
APT. REMINDER CALL, LMTCB °

## 2016-05-12 ENCOUNTER — Ambulatory Visit (INDEPENDENT_AMBULATORY_CARE_PROVIDER_SITE_OTHER): Payer: Medicaid Other | Admitting: Internal Medicine

## 2016-05-12 ENCOUNTER — Encounter: Payer: Self-pay | Admitting: Internal Medicine

## 2016-05-12 DIAGNOSIS — F17210 Nicotine dependence, cigarettes, uncomplicated: Secondary | ICD-10-CM | POA: Diagnosis not present

## 2016-05-12 DIAGNOSIS — L299 Pruritus, unspecified: Secondary | ICD-10-CM

## 2016-05-12 DIAGNOSIS — E785 Hyperlipidemia, unspecified: Secondary | ICD-10-CM | POA: Diagnosis not present

## 2016-05-12 DIAGNOSIS — G8929 Other chronic pain: Secondary | ICD-10-CM

## 2016-05-12 DIAGNOSIS — M25511 Pain in right shoulder: Secondary | ICD-10-CM

## 2016-05-12 DIAGNOSIS — R440 Auditory hallucinations: Secondary | ICD-10-CM | POA: Diagnosis not present

## 2016-05-12 DIAGNOSIS — B353 Tinea pedis: Secondary | ICD-10-CM | POA: Insufficient documentation

## 2016-05-12 DIAGNOSIS — M25562 Pain in left knee: Secondary | ICD-10-CM | POA: Diagnosis not present

## 2016-05-12 DIAGNOSIS — F17200 Nicotine dependence, unspecified, uncomplicated: Secondary | ICD-10-CM

## 2016-05-12 MED ORDER — TERBINAFINE HCL 1 % EX CREA: 1.0000 "application " | TOPICAL_CREAM | Freq: Two times a day (BID) | CUTANEOUS | 0 refills | Status: DC

## 2016-05-12 NOTE — Patient Instructions (Signed)
It was a pleasure to meet you today [name].  I have sent a prescription for a topical antifungal cream to apply to the affected portion of your right foot for 10-14 days that should hopefully resolve the itching.  I also sent a referral to the sports medicine clinic and they will call you to arrange an appointment.  I think it is important that we get you back on some kind of treatment for your hallucinations. Walk in appointments at [facility] are available where they may be able to offer a better treatment than the Risperdal you took in the past.  I will plan to see you again in about 6 months or sooner if you need us for any reason.

## 2016-05-14 ENCOUNTER — Encounter: Payer: Self-pay | Admitting: Internal Medicine

## 2016-05-14 NOTE — Assessment & Plan Note (Signed)
He reports working on his diet in the past few months and thinks he is eating less dietary fat now. He has had intermittent success with smoking cessation and aims to be stopped again by the next time we talk. Will just follow at this time since his risk is not severe and he is working on risk factor modification.

## 2016-05-14 NOTE — Assessment & Plan Note (Signed)
A: He has significant symptoms with pain of his left knee. He notices a substantial effusion during the day if he does not wear a compressive knee brace suggesting some ongoing inflammation. His reduced ROM on flexion is suggestive of arthritic changes. His history of pain and past arthroscopy there is probably meniscal damage present. His pain and laxiy on exam maneuvers suggests likely ligamentous injury as well.  P: Referral to [facility] Recommended quadriceps strengthening exercises as tolerable including squats, extension, leg abduction for knee stabilization

## 2016-05-14 NOTE — Assessment & Plan Note (Signed)
A: Appears to be mild inflammation with reported pruritis between toes 2-4 on right foot. He has not tried anything for this already and has no risk factors for complications.  P: Topical lamisil BID for 10-14 days

## 2016-05-14 NOTE — Assessment & Plan Note (Signed)
This seems to be a stable problem at this time. Given that it continues to bother him he can discuss therapy recommendations with sports medicine for this as well as his more severe problem at his left knee.

## 2016-05-14 NOTE — Assessment & Plan Note (Signed)
It sounds like he had temporary success with nicotine replacement therapy but resumed within 2 months. He states he will probably try to stop again sometime this winter but has a lot going on. Currently 1/2 ppd smoking.

## 2016-05-14 NOTE — Assessment & Plan Note (Signed)
He reports feeling very poorly with resuming risperdal after his visit in March. He stopped taking it and hs been feeling pretty good except some brief symptoms bothering him around May. He is aware of [facility] and knows their contact information says he just hasn't gotten around to following up with a psychiatrist yet. He denies any episodes of suicidal or homicidal thoughts. His mood has been pretty good and is feeling well today.

## 2016-05-14 NOTE — Progress Notes (Signed)
   CC: Right foot itching, left knee pain  HPI:  [name] is a 40 y.o. man with a medical history of seasonal allegies, chronic right shoulder and left knee pain, and some auditory hallucinations in the past who presents today for right foot itching as well as ongoing shoulder and knee pain.  His right foot has been worsening itching with mild tenderness between the toes for about 1-2 weeks. He does not recall any injury to the site and has not noticed similar symptoms before.  His right shoulder is moderately painful which he feels is chronic. His left knee is also painful but this requires him to wear a knee brace or he develops significant pain and swelling during the day. He reports previous arthroscopy of the knee not sure how long ago. He has not fallen and his knee does not lock or become weak during use.  He continues to smoke daily at this time after quitting temporarily with nicotine patches that were helpful. He plans to quit again in the future but feels there is a lot going on right now.   Past Medical History:  Diagnosis Date  . Allergy    Seasonal allergic rhinitis    Review of Systems:  Review of Systems  Constitutional: Negative for fever.  Eyes: Negative for blurred vision.  Respiratory: Negative for shortness of breath.   Cardiovascular: Negative for chest pain and palpitations.  Gastrointestinal: Negative for abdominal pain.  Musculoskeletal: Positive for joint pain.  Skin: Positive for itching. Negative for rash.  Neurological: Negative for dizziness, tremors and headaches.  Endo/Heme/Allergies: Positive for environmental allergies.  Psychiatric/Behavioral: Positive for hallucinations. Negative for depression and suicidal ideas.     Physical Exam:  Vitals:   05/12/16 1407  BP: (!) 144/99  Pulse: 75  Temp: 98 F (36.7 C)  TempSrc: Oral  SpO2: 100%  Weight: 196 lb 11.2 oz (89.2 kg)  Height: 5\' 11"  (1.803 m)   GENERAL- alert, co-operative,  NAD HEENT- Atraumatic, PERRL, EOMI, oral mucosa appears moist CARDIAC- RRR, no murmurs, rubs or gallops. RESP- CTAB, no wheezes or crackles. NEURO- strength upper and lower extremities- 5/5, Sensation intact globally EXTREMITIES- Left knee in brace, left knee ROM reduced about 20 degrees in flexion, some laxity with tenderness on anterior drawer test Right shoulder no deformity and full ROM on exam, some tenderness over body of right trapezius and maybe supraspinatus Right foot mild erythema between 2nd, 3rd, and 4th digits, with very mild skin cracking between 2nd and 3rd SKIN- Warm, dry, no rash/lesion except right foot as above PSYCH- Normal mood and affect, appropriate thought content and speech.   Assessment & Plan:   See Encounters Tab for problem based charting.  Patient discussed with Dr. Oswaldo DoneVincent

## 2016-05-16 NOTE — Progress Notes (Signed)
Internal Medicine Clinic Attending  Case discussed with Dr. Rice soon after the resident saw the patient.  We reviewed the resident's history and exam and pertinent patient test results.  I agree with the assessment, diagnosis, and plan of care documented in the resident's note. 

## 2016-05-17 ENCOUNTER — Encounter (HOSPITAL_COMMUNITY): Payer: Self-pay | Admitting: Emergency Medicine

## 2016-05-17 ENCOUNTER — Other Ambulatory Visit: Payer: Self-pay

## 2016-05-17 ENCOUNTER — Emergency Department (HOSPITAL_COMMUNITY): Payer: Medicaid Other

## 2016-05-17 ENCOUNTER — Observation Stay (HOSPITAL_COMMUNITY)
Admission: EM | Admit: 2016-05-17 | Discharge: 2016-05-18 | Disposition: A | Discharge status: Home / Self Care | Payer: Medicaid Other | Attending: Internal Medicine | Admitting: Internal Medicine

## 2016-05-17 DIAGNOSIS — R9431 Abnormal electrocardiogram [ECG] [EKG]: Secondary | ICD-10-CM

## 2016-05-17 DIAGNOSIS — R0789 Other chest pain: Principal | ICD-10-CM | POA: Insufficient documentation

## 2016-05-17 DIAGNOSIS — Z79899 Other long term (current) drug therapy: Secondary | ICD-10-CM | POA: Diagnosis not present

## 2016-05-17 DIAGNOSIS — F17210 Nicotine dependence, cigarettes, uncomplicated: Secondary | ICD-10-CM | POA: Insufficient documentation

## 2016-05-17 DIAGNOSIS — R079 Chest pain, unspecified: Secondary | ICD-10-CM | POA: Diagnosis present

## 2016-05-17 DIAGNOSIS — I517 Cardiomegaly: Secondary | ICD-10-CM

## 2016-05-17 DIAGNOSIS — Z716 Tobacco abuse counseling: Secondary | ICD-10-CM

## 2016-05-17 DIAGNOSIS — E785 Hyperlipidemia, unspecified: Secondary | ICD-10-CM | POA: Insufficient documentation

## 2016-05-17 DIAGNOSIS — J309 Allergic rhinitis, unspecified: Secondary | ICD-10-CM | POA: Diagnosis not present

## 2016-05-17 HISTORY — DX: Chest pain, unspecified: R07.9

## 2016-05-17 HISTORY — DX: Hyperlipidemia, unspecified: E78.5

## 2016-05-17 LAB — BASIC METABOLIC PANEL
Anion gap: 8 (ref 5–15)
BUN: 9 mg/dL (ref 6–20)
CO2: 25 mmol/L (ref 22–32)
Calcium: 9.4 mg/dL (ref 8.9–10.3)
Chloride: 104 mmol/L (ref 101–111)
Creatinine, Ser: 0.92 mg/dL (ref 0.61–1.24)
GFR calc Af Amer: >60 mL/min (ref >60)
GFR calc non Af Amer: >60 mL/min (ref >60)
Glucose, Bld: 121 mg/dL — ABNORMAL HIGH (ref 65–99)
Potassium: 4.1 mmol/L (ref 3.5–5.1)
Sodium: 137 mmol/L (ref 135–145)

## 2016-05-17 LAB — CBC
HCT: 43.7 % (ref 39.0–52.0)
Hemoglobin: 14.8 g/dL (ref 13.0–17.0)
MCH: 30.5 pg (ref 26.0–34.0)
MCHC: 33.9 g/dL (ref 30.0–36.0)
MCV: 89.9 fL (ref 78.0–100.0)
Platelets: 272 10*3/uL (ref 150–400)
RBC: 4.86 MIL/uL (ref 4.22–5.81)
RDW: 12.9 % (ref 11.5–15.5)
WBC: 7.4 10*3/uL (ref 4.0–10.5)

## 2016-05-17 LAB — I-STAT CHEM 8, ED
BUN: 11 mg/dL (ref 6–20)
Calcium, Ion: 1.2 mmol/L (ref 1.13–1.30)
Chloride: 101 mmol/L (ref 101–111)
Creatinine, Ser: 0.9 mg/dL (ref 0.61–1.24)
Glucose, Bld: 123 mg/dL — ABNORMAL HIGH (ref 65–99)
HCT: 44 % (ref 39.0–52.0)
Hemoglobin: 15 g/dL (ref 13.0–17.0)
Potassium: 3.7 mmol/L (ref 3.5–5.1)
Sodium: 140 mmol/L (ref 135–145)
TCO2: 28 mmol/L (ref 0–100)

## 2016-05-17 LAB — MAGNESIUM: Magnesium: 2.3 mg/dL (ref 1.7–2.4)

## 2016-05-17 LAB — I-STAT TROPONIN, ED: Troponin i, poc: 0 ng/mL (ref 0.00–0.08)

## 2016-05-17 MED ORDER — ASPIRIN 81 MG PO CHEW
324.0000 mg | CHEWABLE_TABLET | Freq: Once | ORAL | Status: AC
  Administered 2016-05-17: 324 mg via ORAL
  Filled 2016-05-17: qty 4

## 2016-05-17 MED ORDER — HEPARIN SODIUM (PORCINE) 5000 UNIT/ML IJ SOLN
INTRAMUSCULAR | Status: AC
  Filled 2016-05-17: qty 1

## 2016-05-17 NOTE — ED Triage Notes (Signed)
Pt. reports central chest pain onset this morning , denies SOB ,, no nausea or diaphoresis , denies  pain at arrival. .

## 2016-05-17 NOTE — ED Notes (Signed)
Pt placed on room monitor, as well as Zoll monitor. Two IVs placed. 324mg  ASA given. Instructed to hold heparin for time being.

## 2016-05-17 NOTE — ED Provider Notes (Signed)
Emergency Department Provider Note   I have reviewed the triage vital signs and the nursing notes.   HISTORY  Chief Complaint Chest Pain   HPI Keith LedererStephen Green is a 40 y.o. male with PMH of HLD and tobacco use presents to the emergency department for evaluation of chest discomfort and abnormal EKG. Patient states that yesterday he was enrolling in a research study and [place] when they performed a routine EKG. He was having no symptoms at that time but they told him that it seemed abnormal and he should present to the hospital for further evaluation. The patient drove from [place] to [place] where he lives and presented to the emergency department. He reports intermittent gas-like discomfort in his left chest. No obvious provoking or alleviating factors. Reports he's had this over the past several days but did not think much of it. No productive cough or fevers. No prior history of heart attack. The patient's significant other became concerned and insisted that he present to the emergency department today.   Past Medical History:  Diagnosis Date  . Allergy    Seasonal allergic rhinitis  . Chest pain 04/2016  . HLD (hyperlipidemia)     Patient Active Problem List   Diagnosis Date Noted  . Chest pain 05/18/2016  . LVH (left ventricular hypertrophy)   . Abnormal ECG   . Smoking   . Encounter for smoking cessation counseling   . Fungal infection right foot 05/12/2016  . Left knee pain 05/12/2016  . HLD (hyperlipidemia) 12/16/2015  . 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 Allergic rhinitis 12/15/2015  . Health care maintenance 12/15/2015  . TOBACCO ABUSE 10/12/2010  . SHOULDER PAIN, RIGHT 10/12/2010    Past Surgical History:  Procedure Laterality Date  . KNEE ARTHROSCOPY Left       Allergies Review of patient's allergies indicates no known allergies.  Family History  Problem Relation Age of Onset  . Hypertension Mother     Social History Social History    Substance Use Topics  . Smoking status: Current Every Day Smoker    Packs/day: 0.50    Years: 25.00    Types: Cigarettes  . Smokeless tobacco: Never Used  . Alcohol use No    Review of Systems  Constitutional: No fever/chills Eyes: No visual changes. ENT: No sore throat. Cardiovascular: Intermittent mild chest pain. Respiratory: Denies shortness of breath. Gastrointestinal: No abdominal pain.  No nausea, no vomiting.  No diarrhea.  No constipation. Genitourinary: Negative for dysuria. Musculoskeletal: Negative for back pain. Skin: Negative for rash. Neurological: Negative for headaches, focal weakness or numbness.  10-point ROS otherwise negative.  ____________________________________________   PHYSICAL EXAM:  VITAL SIGNS: ED Triage Vitals  Enc Vitals Group     BP 05/17/16 2205 138/83     Pulse Rate 05/17/16 2205 70     Resp 05/17/16 2205 18     Temp 05/17/16 2205 98.6 F (37 C)     Temp Source 05/17/16 2205 Oral     SpO2 05/17/16 2205 99 %     Weight 05/17/16 2206 197 lb (89.4 kg)     Height 05/17/16 2206 6' (1.829 m)   Constitutional: Alert and oriented. Well appearing and in no acute distress. Eyes: Conjunctivae are normal. Head: Atraumatic. Nose: No congestion/rhinnorhea. Mouth/Throat: Mucous membranes are moist.  Oropharynx non-erythematous. Neck: No stridor.   Cardiovascular: Normal rate, regular rhythm. Good peripheral circulation. Grossly normal heart sounds.   Respiratory: Normal respiratory effort.  No retractions. Lungs CTAB. Gastrointestinal: Soft and  nontender. No distention.  Musculoskeletal: No lower extremity tenderness nor edema. No gross deformities of extremities. Neurologic:  Normal speech and language. No gross focal neurologic deficits are appreciated.  Skin:  Skin is warm, dry and intact. No rash noted. Psychiatric: Mood and affect are normal. Speech and behavior are normal.  ____________________________________________   LABS (all  labs ordered are listed, but only abnormal results are displayed)  Labs Reviewed  BASIC METABOLIC PANEL - Abnormal; Notable for the following:       Result Value   Glucose, Bld 121 (*)    All other components within normal limits  D-DIMER, QUANTITATIVE (NOT AT [facility]) - Abnormal; Notable for the following:    D-Dimer, Quant 2.45 (*)    All other components within normal limits  LIPID PANEL - Abnormal; Notable for the following:    HDL 34 (*)    All other components within normal limits  I-STAT CHEM 8, ED - Abnormal; Notable for the following:    Glucose, Bld 123 (*)    All other components within normal limits  CBC  MAGNESIUM  TROPONIN I  TROPONIN I  TROPONIN I  BASIC METABOLIC PANEL  URINE RAPID DRUG SCREEN, HOSP PERFORMED  I-STAT TROPOININ, ED  I-STAT TROPOININ, ED  I-STAT TROPOININ, ED   ____________________________________________  EKG   EKG Interpretation  Date/Time:  Wednesday May 17 2016 22:34:36 EDT Ventricular Rate:  71 PR Interval:    QRS Duration: 119 QT Interval:  406 QTC Calculation: 442 R Axis:   -64 Text Interpretation:  Sinus rhythm Borderline prolonged PR interval Left anterior fascicular block Left ventricular hypertrophy No STEMI.  Confirmed by Joh Rao MD, Teghan Philbin [phone]([id number]) on 05/17/2016 11:49:13 PM       ____________________________________________  RADIOLOGY  Ct Angio Chest Pe W Or Wo Contrast  Result Date: 05/18/2016 CLINICAL DATA:  40 y/o  M; chest pain. EXAM: CT ANGIOGRAPHY CHEST WITH CONTRAST TECHNIQUE: Multidetector CT imaging of the chest was performed using the standard protocol during bolus administration of intravenous contrast. Multiplanar CT image reconstructions and MIPs were obtained to evaluate the vascular anatomy. CONTRAST:  100 cc Isovue 370. COMPARISON:  Chest radiograph 05/17/2016 FINDINGS: Mediastinum/Lymph Nodes: No pulmonary emboli or thoracic aortic dissection identified. No masses or pathologically enlarged lymph nodes  identified. Lungs/Pleura: Small pulmonary nodules in the right lung apex and in left lower lobe superior segment enlarged measuring 5.6 mm in the left lower lobe series 5, image 67. No acute airspace disease. New in the Upper abdomen: No acute findings. Musculoskeletal: No chest wall mass or suspicious bone lesions identified. Review of the MIP images confirms the above findings. IMPRESSION: 1. No pulmonary emboli or thoracic aortic dissection identified. 2. No acute airspace disease. No finding as explanation for chest pain. 3. **An incidental finding of potential clinical significance has been found. Multiple pulmonary nodules measuring up to 5.6 mm. No follow-up needed if patient is low-risk (and has no known or suspected primary neoplasm). Non-contrast chest CT can be considered in 12 months if patient is high-risk. This recommendation follows the consensus statement: Guidelines for Management of Incidental Pulmonary Nodules Detected on CT Images:From the Fleischner Society 2017; published online before print (10.1148/[id number]).** Electronically Signed   By: Mitzi HansenLance  Furusawa-Stratton M.D.   On: 05/18/2016 06:02   Dg Chest Portable 1 View  Result Date: 05/17/2016 CLINICAL DATA:  Chest pain, STEMI EXAM: PORTABLE CHEST 1 VIEW COMPARISON:  01/27/2016 FINDINGS: Mild bronchitic changes. No focal consolidation. No pleural effusion or pneumothorax. The heart is  normal in size. IMPRESSION: No evidence of acute cardiopulmonary disease. Electronically Signed   By: Charline Bills M.D.   On: 05/17/2016 22:41    ____________________________________________   PROCEDURES  Procedure(s) performed:   Procedures  None ____________________________________________   INITIAL IMPRESSION / ASSESSMENT AND PLAN / ED COURSE  Pertinent labs & imaging results that were available during my care of the patient were reviewed by me and considered in my medical decision making (see chart for details).  Patient  with ST elevation in V1, V2 with mild chest discomfort at this time. Activated code STEMI. Will give aspirin, send labs, and reassess. Plan to discuss with cardiology immediately. Updated patient regarding my concern and need for urgent evaluation and close monitoring. Story is very benign sounding with minimal symptoms but some discomfort at this time.   10:30 PM Discussed case and reviewed EKG with Dr. Eldridge Dace, cardiologist on call. We reviewed the EKG and feel that with the story and ST morphology we will follow enzymes. No heparin at this time. Cancelled code STEMI. Will repeat EKG.   11:02 PM Repeat EKG much improved. Cardiology has seen and recommends medicine admission and enzyme trending. See attached consultation note. Patient remains hemodynamically stable and symptom free.   12:30 AM Spoke with medicine team regarding admission. They will be down to see the patient and place admission orders. Updated patient regarding admission plan. Removed from Zoll monitor. Will continue on telemetry.    ____________________________________________  FINAL CLINICAL IMPRESSION(S) / ED DIAGNOSES  Final diagnoses:  Nonspecific chest pain  Chest pain     MEDICATIONS GIVEN DURING THIS VISIT:  Medications  nicotine (NICODERM CQ - dosed in mg/24 hours) patch 14 mg (14 mg Transdermal Patch Applied 05/18/16 0252)  acetaminophen (TYLENOL) tablet 650 mg (not administered)  ondansetron (ZOFRAN) injection 4 mg (not administered)  morphine 2 MG/ML injection 2 mg (not administered)  aspirin EC tablet 81 mg (81 mg Oral Given 05/18/16 1048)  nitroGLYCERIN (NITROSTAT) SL tablet 0.4 mg (not administered)  enoxaparin (LOVENOX) injection 90 mg (not administered)  pneumococcal 23 valent vaccine (PNU-IMMUNE) injection 0.5 mL (not administered)  aspirin chewable tablet 324 mg (324 mg Oral Given 05/17/16 2233)  enoxaparin (LOVENOX) injection 90 mg (90 mg Subcutaneous Given 05/18/16 0301)  iopamidol (ISOVUE-370)  76 % injection (100 mLs  Contrast Given 05/18/16 0542)     NEW OUTPATIENT MEDICATIONS STARTED DURING THIS VISIT:  None   Note:  This document was prepared using Dragon voice recognition software and may include unintentional dictation errors.  Alona Bene, MD Emergency Medicine  Maia Plan, MD 05/18/16 1225

## 2016-05-18 ENCOUNTER — Encounter (HOSPITAL_COMMUNITY): Payer: Self-pay | Admitting: General Practice

## 2016-05-18 ENCOUNTER — Observation Stay (HOSPITAL_BASED_OUTPATIENT_CLINIC_OR_DEPARTMENT_OTHER): Payer: Medicaid Other

## 2016-05-18 ENCOUNTER — Observation Stay (HOSPITAL_COMMUNITY): Payer: Medicaid Other

## 2016-05-18 DIAGNOSIS — R072 Precordial pain: Secondary | ICD-10-CM

## 2016-05-18 DIAGNOSIS — R079 Chest pain, unspecified: Secondary | ICD-10-CM

## 2016-05-18 DIAGNOSIS — Z720 Tobacco use: Secondary | ICD-10-CM

## 2016-05-18 DIAGNOSIS — Z716 Tobacco abuse counseling: Secondary | ICD-10-CM

## 2016-05-18 DIAGNOSIS — E785 Hyperlipidemia, unspecified: Secondary | ICD-10-CM | POA: Diagnosis not present

## 2016-05-18 DIAGNOSIS — R9431 Abnormal electrocardiogram [ECG] [EKG]: Secondary | ICD-10-CM | POA: Diagnosis not present

## 2016-05-18 DIAGNOSIS — R0789 Other chest pain: Secondary | ICD-10-CM | POA: Diagnosis not present

## 2016-05-18 DIAGNOSIS — F17210 Nicotine dependence, cigarettes, uncomplicated: Secondary | ICD-10-CM | POA: Diagnosis not present

## 2016-05-18 DIAGNOSIS — I517 Cardiomegaly: Secondary | ICD-10-CM

## 2016-05-18 DIAGNOSIS — J309 Allergic rhinitis, unspecified: Secondary | ICD-10-CM | POA: Diagnosis not present

## 2016-05-18 LAB — ECHOCARDIOGRAM COMPLETE
Height: 72 in
Weight: 3152 oz

## 2016-05-18 LAB — I-STAT TROPONIN, ED: Troponin i, poc: 0 ng/mL (ref 0.00–0.08)

## 2016-05-18 LAB — BASIC METABOLIC PANEL
Anion gap: 6 (ref 5–15)
BUN: 6 mg/dL (ref 6–20)
CO2: 30 mmol/L (ref 22–32)
Calcium: 9.3 mg/dL (ref 8.9–10.3)
Chloride: 102 mmol/L (ref 101–111)
Creatinine, Ser: 0.97 mg/dL (ref 0.61–1.24)
GFR calc Af Amer: >60 mL/min (ref >60)
GFR calc non Af Amer: >60 mL/min (ref >60)
Glucose, Bld: 98 mg/dL (ref 65–99)
Potassium: 4.1 mmol/L (ref 3.5–5.1)
Sodium: 138 mmol/L (ref 135–145)

## 2016-05-18 LAB — TROPONIN I
Troponin I: <0.03 ng/mL (ref <0.03)
Troponin I: <0.03 ng/mL (ref <0.03)
Troponin I: <0.03 ng/mL (ref <0.03)

## 2016-05-18 LAB — LIPID PANEL
Cholesterol: 144 mg/dL (ref 0–200)
HDL: 34 mg/dL — ABNORMAL LOW (ref >40)
LDL Cholesterol: 90 mg/dL (ref 0–99)
Total CHOL/HDL Ratio: 4.2 RATIO
Triglycerides: 102 mg/dL (ref <150)
VLDL: 20 mg/dL (ref 0–40)

## 2016-05-18 LAB — D-DIMER, QUANTITATIVE: D-Dimer, Quant: 2.45 ug/mL-FEU — ABNORMAL HIGH (ref 0.00–0.50)

## 2016-05-18 MED ORDER — IOPAMIDOL (ISOVUE-370) INJECTION 76%
INTRAVENOUS | Status: AC
  Administered 2016-05-18: 100 mL
  Filled 2016-05-18: qty 100

## 2016-05-18 MED ORDER — ENOXAPARIN SODIUM 100 MG/ML ~~LOC~~ SOLN
90.0000 mg | SUBCUTANEOUS | Status: AC
  Administered 2016-05-18: 90 mg via SUBCUTANEOUS
  Filled 2016-05-18: qty 1

## 2016-05-18 MED ORDER — MORPHINE SULFATE (PF) 2 MG/ML IV SOLN: 2.0000 mg | INTRAVENOUS | Status: DC | PRN

## 2016-05-18 MED ORDER — ASPIRIN EC 81 MG PO TBEC
81.0000 mg | DELAYED_RELEASE_TABLET | Freq: Every day | ORAL | Status: DC
  Administered 2016-05-18: 81 mg via ORAL
  Filled 2016-05-18: qty 1

## 2016-05-18 MED ORDER — PNEUMOCOCCAL VAC POLYVALENT 25 MCG/0.5ML IJ INJ: 0.5000 mL | INJECTION | INTRAMUSCULAR | Status: DC

## 2016-05-18 MED ORDER — ENOXAPARIN SODIUM 100 MG/ML ~~LOC~~ SOLN: 90.0000 mg | Freq: Two times a day (BID) | SUBCUTANEOUS | Status: DC

## 2016-05-18 MED ORDER — NICOTINE 14 MG/24HR TD PT24
14.0000 mg | MEDICATED_PATCH | TRANSDERMAL | Status: DC
  Administered 2016-05-18: 14 mg via TRANSDERMAL
  Filled 2016-05-18: qty 1

## 2016-05-18 MED ORDER — NITROGLYCERIN 0.4 MG SL SUBL: 0.4000 mg | SUBLINGUAL_TABLET | SUBLINGUAL | Status: DC | PRN

## 2016-05-18 MED ORDER — ONDANSETRON HCL 4 MG/2ML IJ SOLN: 4.0000 mg | Freq: Four times a day (QID) | INTRAMUSCULAR | Status: DC | PRN

## 2016-05-18 MED ORDER — ACETAMINOPHEN 325 MG PO TABS: 650.0000 mg | ORAL_TABLET | ORAL | Status: DC | PRN

## 2016-05-18 NOTE — Consult Note (Signed)
Admit date: 05/17/2016 Referring Physician  Dr. Jacqulyn BathLong Primary Cardiologist  none Reason for Consultation  Chest pain  HPI: This is a 40yo healthly AA male with no past medical history who presented to the ER with complaints of chest pain.  He was driving home from [place] and started having sharp pain in his left chest without any radiation.  The pain was worse with inspiration.  It spontaneously resolved on its own but reoccurred a little later.  He denied any SOB, nausea or diaphoresis.  When he got home he told his wife who made him come to the ER for evaluation.  In ER a code STEMI was called for his EKG and was reviewed by Interventional Cardiologist, Dr. Eldridge DaceVaranasi and the code STEMI was cancelled.  He is currently pain free.  He has no family history of CAD.  He does smoke < 1/2ppd for 25 years.  Cardiology is now asked to consult.      PMH:   Past Medical History:  Diagnosis Date  . Allergy    Seasonal allergic rhinitis     PSH:   Past Surgical History:  Procedure Laterality Date  . KNEE ARTHROSCOPY Left     Allergies:  Review of patient's allergies indicates no known allergies. Prior to Admit Meds:   (Not in a hospital admission) Fam HX:    Family History  Problem Relation Age of Onset  . Hypertension Mother    Social HX:    Social History   Social History  . Marital status: Married    Spouse name: N/A  . Number of children: N/A  . Years of education: N/A   Occupational History  . Not on file.   Social History Main Topics  . Smoking status: Current Every Day Smoker    Packs/day: 0.50    Years: 25.00    Types: Cigarettes  . Smokeless tobacco: Never Used  . Alcohol use No  . Drug use: No  . Sexual activity: Yes    Partners: Female   Other Topics Concern  . 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      ROS:  All 11 ROS were addressed and are negative except what is stated in the HPI  Physical Exam: Blood pressure  124/88, pulse (!) 59, temperature 97.9 F (36.6 C), temperature source Oral, resp. rate 16, height 6' (1.829 m), weight 197 lb (89.4 kg), SpO2 96 %.    General: Well developed, well nourished, in no acute distress Head: Eyes PERRLA, No xanthomas.   Normal cephalic and atramatic  Lungs:   Clear bilaterally to auscultation and percussion. Heart:   HRRR S1 S2 Pulses are 2+ & equal.            No carotid bruit. No JVD.  No abdominal bruits. No femoral bruits. Abdomen: Bowel sounds are positive, abdomen soft and non-tender without masses or                  Hernia's noted. Msk:  Back normal, normal gait. Normal strength and tone for age. Extremities:   No clubbing, cyanosis or edema.  DP +1 Neuro: Alert and oriented X 3. Psych:  Good affect, responds appropriately    Labs:   Lab Results  Component Value Date   WBC 7.4 05/17/2016   HGB 15.0 05/17/2016   HCT 44.0 05/17/2016   MCV 89.9 05/17/2016   PLT 272 05/17/2016  Recent Labs Lab 05/17/16 2216 05/17/16 2240  NA 137 140  K 4.1 3.7  CL 104 101  CO2 25  --   BUN 9 11  CREATININE 0.92 0.90  CALCIUM 9.4  --   GLUCOSE 121* 123*   No results found for: PTT No results found for: INR, PROTIME Lab Results  Component Value Date   TROPONINI <0.03 01/27/2016     Lab Results  Component Value Date   CHOL 202 (H) 12/15/2015   Lab Results  Component Value Date   HDL 46 12/15/2015   Lab Results  Component Value Date   LDLCALC 140 (H) 12/15/2015   Lab Results  Component Value Date   TRIG 78 12/15/2015   Lab Results  Component Value Date   CHOLHDL 4.4 12/15/2015   No results found for: LDLDIRECT    Radiology:  Dg Chest Portable 1 View  Result Date: 05/17/2016 CLINICAL DATA:  Chest pain, STEMI EXAM: PORTABLE CHEST 1 VIEW COMPARISON:  01/27/2016 FINDINGS: Mild bronchitic changes. No focal consolidation. No pleural effusion or pneumothorax. The heart is normal in size. IMPRESSION: No evidence of acute cardiopulmonary  disease. Electronically Signed   By: Charline BillsSriyesh  Krishnan M.D.   On: 05/17/2016 22:41    EKG:  NSR with J point elevation in V2 and LVH by voltage criteria.  Compared to EKG 03/05/2013 there is no change   ASSESSMENT/PLAN:   1.  Atypical chest pain that is more pleuritic in nature with no associated symptoms.  EKG initially called a STEMI but cancelled.  EKG on arrival showed J point elevation in V2 but unchanged from prior EKG 02/2013.  The second EKG  In ER tonight shows lead reversal in precordial leads.  Patient currently pain free.  Given CRF of tobacco abuse, hospitalist will admit patient for observation and cycle enzymes. Recommend checking d-dimer with pleuritic component and recent car ride to [place] and back. Check 2D echo in am and if LVF is normal consider outpt ETT.  Armanda Magicraci Jahmar Mckelvy, MD  05/18/2016  12:04 AM

## 2016-05-18 NOTE — H&P (Signed)
Date: 05/18/2016               Patient Name:  Keith LedererStephen Green MRN: [id number]  DOB: Dec 23, 1975 Age / Sex: 40 y.o., male   PCP: Fuller Planhristopher W Rice, MD         Medical Service: Internal Medicine Teaching Service         Attending Physician: Dr. Earl LagosNischal Narendra, MD    First Contact: Dr. Nyra MarketGorica Svalina Pager: [phone]  Second Contact: Dr. Hyacinth Meekerasrif Ahmed Pager: [phone]       After Hours (After 5p/  First Contact Pager: [phone]  weekends / holidays): Second Contact Pager: [phone]   Chief Complaint: Chest discomfort, abnormal EKG  History of Present Illness: Keith Green is a 40 year old man with PMH of HLD and tobacco use who presents to the ED for evaluation of chest discomfort and abnormal EKG. Yesterday he was enrolling in a research study in [place] and screening EKG was abnormal. He was completely asymptomatic at that time, and was told to visit his doctor as soon as possible. He then embarked on his planned drive home to [place], [place] yesterday evening. During his ride home he noticed three episodes of sharp substernal chest discomfort, each with gradual onset and lasting 30-45 seconds and associated with mild shortness of breath and subjective warmth. These episodes of chest pain were pleuritic in nature, did not radiate, and self-resolved with no recurrence since that time. He denies any nausea, diaphoresis, extremity swelling, cough, reflux symptoms, fevers/chills, or chest pain on exertion but endorses mild dyspnea on exertion when playing with his children. When he arrived at home his wife urged him to come to the ED for evaluation.   In the ED he was HDS without chest pain on arrival, initial troponin 0.0 and CBC/BMP unremarkable. CXR normal. Initial EKG was concerning for V1/V2 elevations and code STEMI was called. Interventional Cardiology review of EKG characterized J-point elevation in V2 that was unchanged from 2014, and short-term repeat EKG showed improvement so code STEMI  was cancelled.   Meds:  Zyrtec prn Nicoderm  Allergies: Allergies as of 05/17/2016  . (No Known Allergies)   Past Medical History:  Diagnosis Date  . Allergy    Seasonal allergic rhinitis    Family History: Negative for CAD/MI, grandfather had CVA Family History  Problem Relation Age of Onset  . Hypertension Mother     Social History:  Social History   Social History  . Marital status: Married    Spouse name: N/A  . Number of children: N/A  . Years of education: N/A   Occupational History  . Not on file.   Social History Main Topics  . Smoking status: Current Every Day Smoker    Packs/day: 0.50    Years: 25.00    Types: Cigarettes  . Smokeless tobacco: Never Used  . Alcohol use No  . Drug use: No  . Sexual activity: Yes    Partners: Female   Other Topics Concern  . 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    Review of Systems: A complete ROS was negative except as per HPI.   Physical Exam: Blood pressure 125/86, pulse (!) 57, temperature 97.9 F (36.6 C), temperature source Oral, resp. rate 14, height 6' (1.829 m), weight 89.4 kg (197 lb), SpO2 96 %.  General appearance: Man with athletic build resting comfortably in bed, in no distress, comfortable HENT: Normocephalic, atraumatic,  moist mucous membranes, neck supple, no JVD Eyes: PERRL, EOM intact Cardiovascular: RRR, normal S1 S2 with no m/r/g Respiratory: Clear to auscultation bilaterally, normal work of breathing Abdomen: BS+, soft, non-tender, non-distended Extremities: Normal bulk and range of motion, no edema, 2+ peripheral pulses Skin: Warm, dry, intact Neuro: Alert and oriented, cranial nerves grossly intact Psych: Appropriate affect, speech, and thought process  EKG: NSR with J point elevation in V2 and LVH by voltage criteria.  Compared to EKG 03/05/2013 there is no change   CXR: No evidence of acute cardiopulmonary disease  Assessment & Plan by  Problem:  Chest discomfort with abnormal EKG, atypical chest pain symptoms, not present on admission. Initial trop 0.0. EKG on arrival showed J point elevation in V2 but unchanged from prior (02/2013) and short term follow up EKG showed lead reversal in precordial leads. Likely normal variant EKG. Overall, minimal concern for ACS. Some concern for PE given recent long drive with chest discomfort. Symptom tempo and description favors MSK or GERD as source of pain.  -- Trend trops x3  -- Check D-dimer  -- Repeat AM EKG  -- TTE tomorrow, if LV function is normal consider outpatient cardiac workup/stress test  -- Tele and continuous pulse ox  -- Check UDS  -- PRNs: nitro, morphine, zofran  Hyperlipidemia, previously managed with diet/exercise  -- Check lipid panel   FEN/GI: Heart healthy, replete electrolytes as needed  DVT ppx: Lovenox  Code status: Full code  Dispo: Admit patient to Observation with expected length of stay less than 2 midnights.  Signed: Althia FortsAdam Stephan Draughn, MD 05/18/2016, 12:51 AM  Pager: [phone]

## 2016-05-18 NOTE — Progress Notes (Signed)
Received call from telemetry stating patient is in 1st and 2nd degree heart block. Patient asymptomatic at this time. Will continue to monitor.

## 2016-05-18 NOTE — Progress Notes (Signed)
Patient received from ED, oriented to room, tele monitoring called in. Call light within reach

## 2016-05-18 NOTE — Progress Notes (Signed)
  Echocardiogram 2D Echocardiogram has been performed.  Arvil ChacoFoster, Khaleed Holan 05/18/2016, 1:25 PM

## 2016-05-18 NOTE — Progress Notes (Signed)
Patient D/C home with wife. VSS. Patient D/C handout given. Personal belongings given to patient including wallet, cellphone, and glasses. Patient states no chest pain or SOB. Patient D/C home with wife.   Valinda HoarLexie Shylynn Bruning RN

## 2016-05-18 NOTE — Progress Notes (Signed)
ANTICOAGULATION CONSULT NOTE - Initial Consult  Pharmacy Consult for Lovenox Indication: chest pain/ACS  No Known Allergies  Patient Measurements: Height: 6' (182.9 cm) Weight: 197 lb (89.4 kg) IBW/kg (Calculated) : 77.6  Vital Signs: Temp: 97.9 F (36.6 C) (08/23 2232) Temp Source: Oral (08/23 2232) BP: 129/91 (08/24 0130) Pulse Rate: 52 (08/24 0130)  Labs:  Recent Labs  05/17/16 2216 05/17/16 2240  HGB 14.8 15.0  HCT 43.7 44.0  PLT 272  --   CREATININE 0.92 0.90    Estimated Creatinine Clearance: 117.4 mL/min (by C-G formula based on SCr of 0.9 mg/dL).   Medical History: Past Medical History:  Diagnosis Date  . Allergy    Seasonal allergic rhinitis    Medications:  No meds at home  Assessment: 40 y.o. M presents with CP. To begin Lovenox for r/o ACS. CBC ok on admission.  Goal of Therapy:  Anti-Xa level 0.6-1 units/ml 4hrs after LMWH dose given Monitor platelets by anticoagulation protocol: Yes   Plan:  Lovenox 90 mg SQ q12h CBC q72h while on Lovenox  Christoper Fabianaron Ilir Mahrt, PharmD, BCPS Clinical pharmacist, pager [phone] 05/18/2016,2:35 AM

## 2016-05-18 NOTE — Progress Notes (Signed)
   Subjective: [name] states he feels back to baseline today; he denies current chest pain or shortness of breath. He again qualified that his past chest pain would come at rest, last for 30-45 seconds and regress without lasting symptoms.  Objective:  Vital signs in last 24 hours: Vitals:   05/18/16 0424 05/18/16 0900 05/18/16 1107 05/18/16 1300  BP: 112/78 124/76 128/86 117/68  Pulse: 68 62 62 64  Resp: 20 20 20 16   Temp: 97.6 F (36.4 C)  97.6 F (36.4 C) 98.4 F (36.9 C)  TempSrc: Oral  Oral Oral  SpO2: 98% 99% 100% 100%  Weight:      Height:       Constitutional: NAD CV: RRR, no murmurs, rubs or gallops Resp: CTAB, no wheezing, rubs or gallops Abd: soft, nondistended, nontender, +BS Ext: warm, 2+ pulses throughout, without edema  Assessment/Plan:  Active Problems:   Chest pain   LVH (left ventricular hypertrophy)   Abnormal ECG   Smoking   Encounter for smoking cessation counseling  Chest discomfort with abnormal EKG: Atypical chest pain symptoms, not present on admission or f/u evaluation. Troponins were negative x3. EKG on arrival showed J point elevation in V2 but unchanged otherwise from prior EKG; f/u EKG showed lead reversal in precordial leads. CTA was performed which showed no PE or thoracic aortic dissection. There was an incidental finding of multiple pulmonary nodules. Cardiology has been consulted; will get TTE today. Due to no calcium deposits in coronary arteries, outpatient stress test will not be pursued either unless TTE results are concerning. Lipid panel reveals cholesterol 144, triglycerides 102, LDL 90 so no statin indicated at this time.  --f/u TTE results; possible discharge if normal --f/u with cardiology clinic  Hyperlipidemia: Patient has a history of hyperlipidemia, previously managed with diet/exercise. His repeat lipid panel was unremarkable as stated above.  --Continue diet/exercise --no statin indicated at this time  Dispo:  Anticipated discharge possibly today, pending ECHO results.   Keith MarketGorica Sukanya Goldblatt, MD 05/18/2016, 1:58 PM Pager: [phone]

## 2016-05-18 NOTE — ED Notes (Signed)
Attempted to call report

## 2016-05-18 NOTE — Progress Notes (Signed)
Paged IM intern to advise about the EKG and that pt was asymptomatic at this time.

## 2016-05-18 NOTE — Discharge Summary (Signed)
Name: Keith LedererStephen Green MRN: [id number] DOB: December 21, 1975 40 y.o. PCP: Fuller Planhristopher W Rice, MD  Date of Admission: 05/17/2016 10:16 PM Date of Discharge: 05/19/2016 Attending Physician: No att. providers found  Discharge Diagnosis: 1. Chest Pain Active Problems:   Chest pain   LVH (left ventricular hypertrophy)   Abnormal ECG   Smoking   Encounter for smoking cessation counseling   Discharge Medications:   Medication List    TAKE these medications   cetirizine 10 MG tablet Commonly known as:  ZYRTEC Take 1 tablet (10 mg total) by mouth daily.   nicotine 14 mg/24hr patch Commonly known as:  NICODERM CQ - dosed in mg/24 hours Place 1 patch (14 mg total) onto the skin daily.   risperiDONE 1 MG tablet Commonly known as:  RISPERDAL Take 1 tablet (1 mg total) by mouth at bedtime.   terbinafine 1 % cream Commonly known as:  LAMISIL AT Apply 1 application topically 2 (two) times daily.       Disposition and follow-up:   [name] was discharged from [facility] in Good condition.  At the hospital follow up visit please address:  1.   --f/u recurrence of chest pain --f/u smoking cessation planning, need for support  2.  Labs / imaging needed at time of follow-up: none  3.  Pending labs/ test needing follow-up:  --Incidental finding of pulmonary nodules on CTA; will need 4271yr follow up with CT (see report below)  Follow-up Appointments: Follow-up Information    Jacolyn ReedyMichele Lenze, PA-C Follow up on 06/05/2016.   Specialty:  Cardiology Why:  Cardiology Hospital Follow-Up on 06/05/2016 at 8:15AM. Contact information: [address] [place] [id number] [phone]        [facility]. Schedule an appointment as soon as possible for a visit today.   Why:  Please call and make an appointment for a hospital follow up in 2-4 weeks. Contact information: [address] [place]  [phone]          Hospital Course by problem list: Active Problems:   Chest pain   LVH (left ventricular hypertrophy)   Abnormal ECG   Smoking   Encounter for smoking cessation counseling   Chest Pain:  Atypical chest pain symptoms, not present on admission or f/u evaluation. Troponins were negative x3. EKG on arrival showed J point elevation in V2 but unchanged otherwise from prior EKG; f/u EKG showed lead reversal in precordial leads. CTA was performed which showed no PE or thoracic aortic dissection. There was an incidental finding of multiple pulmonary nodules. Cardiology has been consulted. Troponins were negative x3. Lipid panel reveals cholesterol 144, triglycerides 102, LDL 90 so no statin indicated at this time. TTE ECHO showed EF 55-60%, mild LVH. Patient has been asymptomatic while inpatient, without chest pain and shortness of breath. Not likely from cardiac or pulmonary etiology, possibly musculoskeletal. No new meds on discharge. Due to no calcium deposits in coronary arteries and unremarkable TTE, outpatient stress test will not be pursued per cardiology. He will follow up with the cardiology clinic.   Hyperlipidemia:  Patient has a history of hyperlipidemia, previously managed with diet/exercise. His repeat lipid panel was unremarkable: cholesterol 144, triglycerides 102, LDL 90. No indication for statin therapy right now. Patient encouraged to continue with diet and exercise management.  Discharge Vitals:   BP 117/68 (BP Location: Right Arm)   Pulse 64   Temp 98.4 F (36.9 C) (Oral)  Resp 16   Ht 6' (1.829 m)   Wt 89.4 kg (197 lb)   SpO2 100%   BMI 26.72 kg/m   Pertinent Labs, Studies, and Procedures:  CBC Latest Ref Rng & Units 05/17/2016 05/17/2016 01/27/2016  WBC 4.0 - 10.5 K/uL - 7.4 6.0  Hemoglobin 13.0 - 17.0 g/dL 16.1 09.6 04.5  Hematocrit 39.0 - 52.0 % 44.0 43.7 42.3  Platelets 150 - 400 K/uL - 272 248   BMP Latest Ref Rng & Units 05/18/2016  05/17/2016 05/17/2016  Glucose 65 - 99 mg/dL 98 409(W) 119(J)  BUN 6 - 20 mg/dL 6 11 9   Creatinine 0.61 - 1.24 mg/dL 4.78 2.95 6.21  Sodium 135 - 145 mmol/L 138 140 137  Potassium 3.5 - 5.1 mmol/L 4.1 3.7 4.1  Chloride 101 - 111 mmol/L 102 101 104  CO2 22 - 32 mmol/L 30 - 25  Calcium 8.9 - 10.3 mg/dL 9.3 - 9.4    Ref. Range 05/18/2016 02:38  Cholesterol Latest Ref Range: 0 - 200 mg/dL 308  Triglycerides Latest Ref Range: <150 mg/dL 657  HDL Cholesterol Latest Ref Range: >40 mg/dL 34 (L)  LDL (calc) Latest Ref Range: 0 - 99 mg/dL 90  VLDL Latest Ref Range: 0 - 40 mg/dL 20  Total CHOL/HDL Ratio Latest Units: RATIO 4.2    Ref. Range 05/18/2016 02:38 05/18/2016 06:46 05/18/2016 09:27  Troponin I Latest Ref Range: <0.03 ng/mL <0.03 <0.03 <0.03   Chest XRay 05/17/2016: No evidence of acute cardiopulmonary process.  CTA Chest 05/17/2016:  1. No pulmonary emboli or thoracic aortic dissection identified. 2. No acute airspace disease. No finding as explanation for chest pain. 3. **An incidental finding of potential clinical significance has been found. Multiple pulmonary nodules measuring up to 5.6 mm. No follow-up needed if patient is low-risk (and has no known or suspected primary neoplasm). Non-contrast chest CT can be considered in 12 months if patient is high-risk.  TTE ECHO 05/18/2016: - Left ventricle: The cavity size was normal. Wall thickness was   increased in a pattern of mild LVH. Systolic function was normal.   The estimated ejection fraction was in the range of 55% to 60%.   Left ventricular diastolic function parameters were normal.   Discharge Instructions: Discharge Instructions    Diet - low sodium heart healthy    Complete by:  As directed   Discharge instructions    Complete by:  As directed   Please follow up with the cardiologist at the appointment set (info below) for further evaluation and management of your chest pain.   Please set up an appointment with the  Internal Medicine clinic for a hospital follow up in 2-4 weeks.   It would be in your best interest to stop smoking; as we've discussed we have support to help you should you be interested.   Increase activity slowly    Complete by:  As directed      Signed: Nyra Market, MD 05/19/2016, 4:44 PM   Pager: [phone]

## 2016-05-18 NOTE — Progress Notes (Addendum)
Patient Name: Keith LedererStephen Green Date of Encounter: 05/18/2016  Active Problems:   Chest pain   Length of Stay: 0  SUBJECTIVE  The patient states that he has no chest pain this morning and he feels back to his baseline.  CURRENT MEDS . aspirin EC  81 mg Oral Daily  . enoxaparin (LOVENOX) injection  90 mg Subcutaneous Q12H  . nicotine  14 mg Transdermal Q24H  . [START ON 05/19/2016] pneumococcal 23 valent vaccine  0.5 mL Intramuscular Tomorrow-1000    OBJECTIVE  Vitals:   05/18/16 0130 05/18/16 0424 05/18/16 0900 05/18/16 1107  BP: 129/91 112/78 124/76 128/86  Pulse: (!) 52 68 62 62  Resp: 17 20 20 20   Temp:  97.6 F (36.4 C)  97.6 F (36.4 C)  TempSrc:  Oral  Oral  SpO2: 97% 98% 99% 100%  Weight:      Height:        Intake/Output Summary (Last 24 hours) at 05/18/16 1130 Last data filed at 05/18/16 1036  Gross per 24 hour  Intake              240 ml  Output              200 ml  Net               40 ml   Filed Weights   05/17/16 2206  Weight: 197 lb (89.4 kg)    PHYSICAL EXAM  General: Pleasant, NAD. Neuro: Alert and oriented X 3. Moves all extremities spontaneously. Psych: Normal affect. HEENT:  Normal  Neck: Supple without bruits or JVD. Lungs:  Resp regular and unlabored, CTA. Heart: RRR no s3, s4, or murmurs. Abdomen: Soft, non-tender, non-distended, BS + x 4.  Extremities: No clubbing, cyanosis or edema. DP/PT/Radials 2+ and equal bilaterally.  Accessory Clinical Findings  CBC  Recent Labs  05/17/16 2216 05/17/16 2240  WBC 7.4  --   HGB 14.8 15.0  HCT 43.7 44.0  MCV 89.9  --   PLT 272  --    Basic Metabolic Panel  Recent Labs  05/17/16 2216 05/17/16 2227 05/17/16 2240 05/18/16 0646  NA 137  --  140 138  K 4.1  --  3.7 4.1  CL 104  --  101 102  CO2 25  --   --  30  GLUCOSE 121*  --  123* 98  BUN 9  --  11 6  CREATININE 0.92  --  0.90 0.97  CALCIUM 9.4  --   --  9.3  MG  --  2.3  --   --     Recent Labs  05/18/16 0238  05/18/16 0646 05/18/16 0927  TROPONINI <0.03 <0.03 <0.03   BNP Invalid input(s): POCBNP D-Dimer  Recent Labs  05/18/16 0238  DDIMER 2.45*    Recent Labs  05/18/16 0238  CHOL 144  HDL 34*  LDLCALC 90  TRIG 621102  CHOLHDL 4.2   Ct Angio Chest Pe W Or Wo Contrast  Result Date: 05/18/2016 CLINICAL DATA:  40 y/o  M; chest pain. EXAM: CT ANGIOGRAPHY CHEST WITH CONTRAST TECHNIQUE: Multidetector CT imaging of the chest was performed using the standard protocol during bolus administration of intravenous contrast. Multiplanar CT image reconstructions and MIPs were obtained to evaluate the vascular anatomy. CONTRAST:  100 cc Isovue 370. COMPARISON:  Chest radiograph 05/17/2016 FINDINGS: Mediastinum/Lymph Nodes: No pulmonary emboli or thoracic aortic dissection identified. No masses or pathologically enlarged lymph nodes identified. Lungs/Pleura: Small pulmonary  nodules in the right lung apex and in left lower lobe superior segment enlarged measuring 5.6 mm in the left lower lobe series 5, image 67. No acute airspace disease. New in the Upper abdomen: No acute findings. Musculoskeletal: No chest wall mass or suspicious bone lesions identified. Review of the MIP images confirms the above findings. IMPRESSION: 1. No pulmonary emboli or thoracic aortic dissection identified. 2. No acute airspace disease. No finding as explanation for chest pain. 3. **An incidental finding of potential clinical significance has been found. Multiple pulmonary nodules measuring up to 5.6 mm. No follow-up needed if patient is low-risk (and has no known or suspected primary neoplasm). Non-contrast chest CT can be considered in 12 months if patient is high-risk. This recommendation follows the consensus statement: Guidelines for Management of Incidental Pulmonary Nodules Detected on CT Images:From the Fleischner Society 2017; published online before print (10.1148/radiol.[id number]).** Electronically Signed   By: Mitzi HansenLance   Furusawa-Stratton M.D.   On: 05/18/2016 06:02   Dg Chest Portable 1 View  Result Date: 05/17/2016 CLINICAL DATA:  Chest pain, STEMI EXAM: PORTABLE CHEST 1 VIEW COMPARISON:  01/27/2016 FINDINGS: Mild bronchitic changes. No focal consolidation. No pleural effusion or pneumothorax. The heart is normal in size. IMPRESSION: No evidence of acute cardiopulmonary disease. Electronically Signed   By: Charline BillsSriyesh  Krishnan M.D.   On: 05/17/2016 22:41   TELE: SB to SR, blocked P waves   ASSESSMENT AND PLAN  40 year old male  1. The patient presented with atypical chest pain that sounded pleuritic in nature and was not associated with exertion. He is EKG showed J-point elevation in V2 that's unchanged from prior EKG in 2014. He also had nonspecific T-wave abnormalities in the inferior leads, those are more pronounced this morning and appear more negative. However the patient is completely asymptomatic. His vitals are normal, he had some blocked P waves while he was asleep most probably secondary to sleep apnea. I been considering acute pericarditis, however his symptoms are completely resolved they were only present yesterday. Considering the nonspecific EKG abnormalities and the facts that he mentions that when playing basketball with his kids he is getting more short of breath we will consider performing an outpatient exercise stress echocardiogram. If no significant abnormality on echocardiogram we will discharge today and have him undergo stress echocardiogram as outpatient followed by an outpatient visit in our office.  Signed, Tobias AlexanderKatarina Lennox Dolberry MD, [facility] 05/18/2016  Addendum: Have reviewed his chest CT for pulmonary embolism, he has no calcium in his coronary arteries, since his chest pain is atypical and mood not perform a stress test. If his echocardiogram normal we will discharge and just follow in our clinic. Smoking cessation was discussed with the clinic, this is most probably the cause of shortness of  breath, he also has multiple nodules on his lung CT that will need to be followed as outpatient.  This patient was admitted at 12:04 AM, so the discharge charges placed on a same day however the patient needed to be seen for further decision making in order to anticipate his discharge.  Tobias AlexanderKatarina Hao Dion 05/18/2016

## 2016-05-23 ENCOUNTER — Encounter: Payer: Self-pay | Admitting: *Deleted

## 2016-05-30 ENCOUNTER — Ambulatory Visit (INDEPENDENT_AMBULATORY_CARE_PROVIDER_SITE_OTHER): Payer: Medicaid Other | Admitting: Family Medicine

## 2016-05-30 ENCOUNTER — Encounter: Payer: Self-pay | Admitting: Family Medicine

## 2016-05-30 DIAGNOSIS — M25562 Pain in left knee: Secondary | ICD-10-CM | POA: Diagnosis present

## 2016-05-30 NOTE — Progress Notes (Signed)
  Keith LedererStephen Green - 40 y.o. male MRN [id number]  Date of birth: 31-Jul-1976  SUBJECTIVE:  Including CC & ROS.  Chief Complaint  Patient presents with  . Knee Pain    [name] is a 40 yo M is presenting with acute on chronic left knee pain. This is been going on for some time. He has been wearing a brace for the past 2 months which has improved his symptoms. He has pain that feels like it is all over his knee. It is worse when he is squatting or not wearing his knee brace. The pain is localized. It can become a 10 out of 10. 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 He has not done anything up to this point. He reports a history of a left patellar dislocation when he was younger. Squatting causes significant pain and has stiffness with prolonged sitting.   ROS: No unexpected weight loss, fever, chills, numbness/tingling, redness, otherwise see HPI    HISTORY: Past Medical, Surgical, Social, and Family History Reviewed & Updated per EMR.   Pertinent Historical Findings include: PMSHx -  HLD, LVH,  Left knee arthroscope  PSHx -  No alcohol use, current tobacco use, landscaper  FHx -  HTN Medications - none  DATA REVIEWED: None to review   PHYSICAL EXAM:  VS: BP:(!) 139/92  HR: bpm  TEMP: ( )  RESP:   HT:6' (182.9 cm)   WT:190 lb (86.2 kg)  BMI:25.8 PHYSICAL EXAM: Gen: NAD, alert, cooperative with exam, well-appearing HEENT: clear conjunctiva, EOMI CV:  no edema, capillary refill brisk,  Resp: non-labored, normal speech Skin: no rashes, normal turgor  Neuro: no gross deficits.  Psych:  alert and oriented Knee: Normal to inspection with no erythema or effusion or obvious bony abnormalities. Palpation normal with no warmth,patellar tenderness, or condyle tenderness. ROM full in flexion and extension and lower leg rotation. Ligaments with solid consistent endpoints including ACL, LCL, MCL. Negative Mcmurray's and Thessalonian tests. Non painful patellar  compression. Patellar and quadriceps tendons unremarkable. Hamstring and quadriceps strength is normal.  Neurovascularly intact  Limited ultrasound: Left knee: Moderate effusion observed in the suprapatellar pouch. Quadricep and patella tendon intact. Lateral and medial meniscus without any significant hypoechoic changes. No Baker's cyst. Some spurring observed in the lateral compartment as well as in the sunrise view.   ASSESSMENT & PLAN:   Left knee pain Findings on ultrasound suggests that he has some arthritic changes within the knee. He may have some meniscal injury but Thessaly was negative today. Most likely his findings are related to the patellar dislocation that he suffered when he was younger. - Encouraged to wear brace as much as possible and avoid squatting - Provided with home exercises - Had the discussion about steroid injection or systemic medications and we will try these in the future if his pain is significant. - Advised to follow-up in 3-4 weeks if no improvement. May need to consider imaging at that point.

## 2016-05-30 NOTE — Assessment & Plan Note (Addendum)
Findings on ultrasound suggests that he has some arthritic changes within the knee. He may have some meniscal injury but Thessaly was negative today. Most likely his findings are related to the patellar dislocation that he suffered when he was younger. - Encouraged to wear brace as much as possible and avoid squatting - Provided with home exercises - Had the discussion about steroid injection or systemic medications and we will try these in the future if his pain is significant. - Advised to follow-up in 3-4 weeks if no improvement. May need to consider imaging at that point.

## 2016-06-05 ENCOUNTER — Encounter: Payer: Self-pay | Admitting: *Deleted

## 2016-06-05 ENCOUNTER — Ambulatory Visit (INDEPENDENT_AMBULATORY_CARE_PROVIDER_SITE_OTHER): Payer: Medicaid Other | Admitting: Physician Assistant

## 2016-06-05 ENCOUNTER — Encounter: Payer: Self-pay | Admitting: Physician Assistant

## 2016-06-05 VITALS — BP 130/70 | HR 80 | Ht 72.0 in | Wt 203.2 lb

## 2016-06-05 DIAGNOSIS — R0789 Other chest pain: Secondary | ICD-10-CM | POA: Diagnosis not present

## 2016-06-05 DIAGNOSIS — Z720 Tobacco use: Secondary | ICD-10-CM | POA: Diagnosis not present

## 2016-06-05 DIAGNOSIS — R918 Other nonspecific abnormal finding of lung field: Secondary | ICD-10-CM

## 2016-06-05 DIAGNOSIS — Z87891 Personal history of nicotine dependence: Secondary | ICD-10-CM

## 2016-06-05 NOTE — Patient Instructions (Addendum)
Medication Instructions:   Your physician recommends that you continue on your current medications as directed. Please refer to the Current Medication list given to you today.   If you need a refill on your cardiac medications before your next appointment, please call your pharmacy.  Labwork: NONE ORDER TODAY    Testing/Procedures: NONE ORDER TODAY     Follow-Up: You have been referred to TO PULMONARY FOR NODULES AND 25 YEARS OF SMOKING  Call or return to clinic prn if any of your symptoms worsen or fail to improve as anticipated.    Any Other Special Instructions Will Be Listed Below (If Applicable).

## 2016-06-05 NOTE — Progress Notes (Signed)
Cardiology Office Note    Date:  06/05/2016   ID:  [name], DOB 09-Jan-1976, MRN [id number]  PCP:  Pincus Badder, MD  Cardiologist: Dr. Mayford Knife  Chief Complaint  Patient presents with  . Follow-up    History of Present Illness:  Keith Green is a 40 y.o. male patient who is admitted to the hospital with atypical chest pain that sounds pleuritic in nature. EKG showed J-point elevation in V2 that's unchanged from prior EKG in 2014. He also had nonspecific T-wave abnormalities in the inferior leads. His symptoms completely resolved in the hospital and he had no exertional symptoms. 2-D echo was completely normal and CT of his chest was negative for pulmonary embolism, he had no calcium in his coronary arteries so stress test was not recommended. He did have multiple nodules on his lung CT so will need follow-up. Smoking cessation was advised.  Patient comes in today feeling quite well. He's had no further chest pain. He was not aware of the pulmonary nodules on his CT. He is trying to quit smoking and is down to 8 cigarettes daily with the patches.    Past Medical History:  Diagnosis Date  . Allergy    Seasonal allergic rhinitis  . Chest pain 04/2016  . HLD (hyperlipidemia)     Past Surgical History:  Procedure Laterality Date  . KNEE ARTHROSCOPY Left     Current Medications: Outpatient Medications Prior to Visit  Medication Sig Dispense Refill  . cetirizine (ZYRTEC) 10 MG tablet Take 1 tablet (10 mg total) by mouth daily. 30 tablet 3  . nicotine (NICODERM CQ - DOSED IN MG/24 HOURS) 14 mg/24hr patch Place 1 patch (14 mg total) onto the skin daily. 30 patch 1  . terbinafine (LAMISIL AT) 1 % cream Apply 1 application topically 2 (two) times daily. 30 g 0  . risperiDONE (RISPERDAL) 1 MG tablet Take 1 tablet (1 mg total) by mouth at bedtime. (Patient not taking: Reported on 01/27/2016) 30 tablet 2   No facility-administered medications prior to visit.      Allergies:    Review of patient's allergies indicates no known allergies.   Social History   Social History  . Marital status: Married    Spouse name: N/A  . Number of children: N/A  . Years of education: N/A   Social History Main Topics  . Smoking status: Current Every Day Smoker    Packs/day: 0.50    Years: 25.00    Types: Cigarettes  . Smokeless tobacco: Never Used  . Alcohol use No  . Drug use: No  . Sexual activity: Yes    Partners: Female   Other Topics Concern  . 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      Family History:  The patient's   family history includes Hypertension in his mother.   ROS:   Please see the history of present illness.    Review of Systems  Constitution: Negative.  HENT: Negative.   Respiratory: Negative.   Endocrine: Negative.   Hematologic/Lymphatic: Negative.   Musculoskeletal: Negative.   Gastrointestinal: Negative.   Genitourinary: Negative.   Neurological: Negative.    All other systems reviewed and are negative.   PHYSICAL EXAM:   VS:  BP 130/70   Pulse 80   Ht 6' (1.829 m)   Wt 203 lb 3.2 oz (92.2 kg)   SpO2 95%   BMI 27.56 kg/m   Physical Exam  GEN: Well nourished, well developed, in no acute distress  Neck: no JVD, carotid bruits, or masses Cardiac:RRR; Positive S4 and 1/6 systolic murmur at the left sternal border, no rub bruit thrill or heave Respiratory:  clear to auscultation bilaterally, normal work of breathing GI: soft, nontender, nondistended, + BS Ext: without cyanosis, clubbing, or edema, Good distal pulses bilaterally MS: no deformity or atrophy  Skin: warm and dry, no rash Psych: euthymic mood, full affect  Wt Readings from Last 3 Encounters:  06/05/16 203 lb 3.2 oz (92.2 kg)  05/30/16 190 lb (86.2 kg)  05/17/16 197 lb (89.4 kg)      Studies/Labs Reviewed:   EKG:  EKG is not ordered today.   Recent Labs: 05/17/2016: Hemoglobin 15.0; Magnesium 2.3; Platelets 272 05/18/2016: BUN  6; Creatinine, Ser 0.97; Potassium 4.1; Sodium 138   Lipid Panel    Component Value Date/Time   CHOL 144 05/18/2016 0238   CHOL 202 (H) 12/15/2015 0932   TRIG 102 05/18/2016 0238   HDL 34 (L) 05/18/2016 0238   HDL 46 12/15/2015 0932   CHOLHDL 4.2 05/18/2016 0238   VLDL 20 05/18/2016 0238   LDLCALC 90 05/18/2016 0238   LDLCALC 140 (H) 12/15/2015 0932    Additional studies/ records that were reviewed today include:   IMPRESSION: 1. No pulmonary emboli or thoracic aortic dissection identified. 2. No acute airspace disease. No finding as explanation for chest pain. 3. **An incidental finding of potential clinical significance has been found. Multiple pulmonary nodules measuring up to 5.6 mm. No follow-up needed if patient is low-risk (and has no known or suspected primary neoplasm). Non-contrast chest CT can be considered in 12 months if patient is high-risk. This recommendation follows the consensus statement: Guidelines for Management of Incidental Pulmonary Nodules Detected on CT Images:From the Fleischner Society 2017; published online before print (10.1148/radiol.[id number]).**     Study Conclusions   - Left ventricle: The cavity size was normal. Wall thickness was   increased in a pattern of mild LVH. Systolic function was normal.   The estimated ejection fraction was in the range of 55% to 60%.   Left ventricular diastolic function parameters were normal.      ASSESSMENT:    1. Pulmonary nodules   2. Hx of smoking   3. Other chest pain      PLAN:  In order of problems listed above:  Pulmonary nodules found incidentally on CT. Patient has 25 year smoking history is trying to quit. Will refer to pulmonary for long-term follow-up.  Chest pain atypical has resolved normal 2-D echo and no calcium on CT. No stress test recommended. Follow-up when necessary for further chest pain.      Medication Adjustments/Labs and Tests Ordered: Current medicines are  reviewed at length with the patient today.  Concerns regarding medicines are outlined above.  Medication changes, Labs and Tests ordered today are listed in the Patient Instructions below. Patient Instructions  Medication Instructions:   Your physician recommends that you continue on your current medications as directed. Please refer to the Current Medication list given to you today.   If you need a refill on your cardiac medications before your next appointment, please call your pharmacy.  Labwork: NONE ORDER TODAY    Testing/Procedures: NONE ORDER TODAY     Follow-Up: You have been referred to TO PULMONARY FOR NODULES AND 25 YEARS OF SMOKING  Call or return to clinic prn if any of your symptoms worsen or fail to improve as  anticipated.    Any Other Special Instructions Will Be Listed Below (If Applicable).                                                                                                                                                      Elson Clan, PA-C  06/05/2016 9:12 AM    [facility] [address] [place], [place], [place]  [address] Phone: [phone]; Fax: [phone]

## 2016-06-09 ENCOUNTER — Encounter: Payer: Medicaid Other | Admitting: Internal Medicine

## 2016-06-16 ENCOUNTER — Encounter: Payer: Medicaid Other | Admitting: Internal Medicine

## 2016-06-27 ENCOUNTER — Institutional Professional Consult (permissible substitution): Payer: Medicaid Other | Admitting: Pulmonary Disease

## 2016-06-30 ENCOUNTER — Institutional Professional Consult (permissible substitution): Payer: Medicaid Other | Admitting: Pulmonary Disease

## 2016-07-19 ENCOUNTER — Ambulatory Visit (INDEPENDENT_AMBULATORY_CARE_PROVIDER_SITE_OTHER): Payer: Medicaid Other | Admitting: Pulmonary Disease

## 2016-07-19 ENCOUNTER — Encounter: Payer: Self-pay | Admitting: Pulmonary Disease

## 2016-07-19 VITALS — BP 128/72 | HR 70 | Ht 72.0 in | Wt 195.0 lb

## 2016-07-19 DIAGNOSIS — F17210 Nicotine dependence, cigarettes, uncomplicated: Secondary | ICD-10-CM

## 2016-07-19 DIAGNOSIS — R911 Solitary pulmonary nodule: Secondary | ICD-10-CM | POA: Diagnosis not present

## 2016-07-19 NOTE — Patient Instructions (Signed)
We will schedule for a CT of the chest without contrast in August 2018. Follow up in clinic after the CT scan for further evaluation.

## 2016-07-19 NOTE — Progress Notes (Signed)
Keith Green    [id number]    August 22, 1976  Primary Care Physician:Keith Jeanine Luz, MD  Referring Physician: Fuller Plan, MD [address] [place], [place] [address]  Chief complaint:  Follow-up for lung nodules  HPI: [name] is a 40 year old active smoker with no significant past medical history. He was evaluated in August 2017 for chest pain. He had a CT of the chest at that time that showed incidental findings of subcentimeter pulmonary nodules. He denies any symptoms of cough, sputum production, dyspnea, wheezing, chest pain, palpitations.  He continues to smoke up to a pack a day and is trying to quit with nicotine replacement therapy.   Outpatient Encounter Prescriptions as of 07/19/2016  Medication Sig  . cetirizine (ZYRTEC) 10 MG tablet Take 1 tablet (10 mg total) by mouth daily. (Patient taking differently: Take 10 mg by mouth daily as needed. )  . nicotine (NICODERM CQ - DOSED IN MG/24 HOURS) 14 mg/24hr patch Place 1 patch (14 mg total) onto the skin daily.  . [DISCONTINUED] terbinafine (LAMISIL AT) 1 % cream Apply 1 application topically 2 (two) times daily. (Patient not taking: Reported on 07/19/2016)   No facility-administered encounter medications on file as of 07/19/2016.     Allergies as of 07/19/2016  . (No Known Allergies)    Past Medical History:  Diagnosis Date  . Allergy    Seasonal allergic rhinitis  . Chest pain 04/2016  . HLD (hyperlipidemia)     Past Surgical History:  Procedure Laterality Date  . KNEE ARTHROSCOPY Left     Family History  Problem Relation Age of Onset  . Hypertension Mother     Social History   Social History  . Marital status: Married    Spouse name: N/A  . Number of children: N/A  . Years of education: N/A   Occupational History  . Not on file.   Social History Main Topics  . Smoking status: Current Every Day Smoker    Packs/day: 1.00    Years: 25.00    Types: Cigarettes  . Smokeless  tobacco: Never Used     Comment: down to 5-8 cigarettes daily 07/19/16  . Alcohol use No  . Drug use: No  . Sexual activity: Yes    Partners: Female   Other Topics Concern  . 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      Review of systems: Review of Systems  Constitutional: Negative for fever and chills.  HENT: Negative.   Eyes: Negative for blurred vision.  Respiratory: as per HPI  Cardiovascular: Negative for chest pain and palpitations.  Gastrointestinal: Negative for vomiting, diarrhea, blood per rectum. Genitourinary: Negative for dysuria, urgency, frequency and hematuria.  Musculoskeletal: Negative for myalgias, back pain and joint pain.  Skin: Negative for itching and rash.  Neurological: Negative for dizziness, tremors, focal weakness, seizures and loss of consciousness.  Endo/Heme/Allergies: Negative for environmental allergies.  Psychiatric/Behavioral: Negative for depression, suicidal ideas and hallucinations.  All other systems reviewed and are negative.   Physical Exam: Blood pressure 128/72, pulse 70, height 6' (1.829 m), weight 195 lb (88.5 kg), SpO2 98 %. Gen:      No acute distress HEENT:  EOMI, sclera anicteric Neck:     No masses; no thyromegaly Lungs:    Clear to auscultation bilaterally; normal respiratory effort CV:         Regular rate and  rhythm; no murmurs Abd:      + bowel sounds; soft, non-tender; no palpable masses, no distension Ext:    No edema; adequate peripheral perfusion Skin:      Warm and dry; no rash Neuro: alert and oriented x 3 Psych: normal mood and affect  Data Reviewed: CT scan 05/18/16-multiple subcentimeter pulmonary nodules. No mass or infiltrate identified.  Assessment:  Subcentimeter pulmonary nodules. Patient is at increased risk due to active smoking. We will repeat a CT scan without contrast in 1 year for follow-up.  Smoking cessation.  I have discussed smoking cessation with him.  He continued to try and quit using nicotine replacement therapy. If unsuccessful and we may consider using Chantix or wellbutrin. Time spent in counseling. 10 mins.  Green/Recommendations: - CT without contrast in 1 year. - Smoking cessation  Chilton GreathousePraveen Terren Haberle MD [facility] Pager [phone] 07/19/2016, 4:07 PM  CC: Keith Green, Keith W, MD

## 2016-08-26 ENCOUNTER — Encounter: Payer: Self-pay | Admitting: Internal Medicine

## 2016-09-01 ENCOUNTER — Ambulatory Visit (INDEPENDENT_AMBULATORY_CARE_PROVIDER_SITE_OTHER): Payer: Medicaid Other | Admitting: Internal Medicine

## 2016-09-01 ENCOUNTER — Encounter: Payer: Self-pay | Admitting: Internal Medicine

## 2016-09-01 VITALS — BP 133/75 | HR 78 | Temp 98.4°F | Ht 72.0 in | Wt 197.9 lb

## 2016-09-01 DIAGNOSIS — F17200 Nicotine dependence, unspecified, uncomplicated: Secondary | ICD-10-CM

## 2016-09-01 DIAGNOSIS — Z716 Tobacco abuse counseling: Secondary | ICD-10-CM | POA: Diagnosis not present

## 2016-09-01 DIAGNOSIS — L84 Corns and callosities: Secondary | ICD-10-CM | POA: Insufficient documentation

## 2016-09-01 DIAGNOSIS — F17210 Nicotine dependence, cigarettes, uncomplicated: Secondary | ICD-10-CM | POA: Diagnosis not present

## 2016-09-01 MED ORDER — BUPROPION HCL ER (SR) 150 MG PO TB12: 150.0000 mg | ORAL_TABLET | Freq: Two times a day (BID) | ORAL | 2 refills | Status: DC

## 2016-09-01 MED ORDER — NICOTINE POLACRILEX 4 MG MT GUM: 4.0000 mg | CHEWING_GUM | OROMUCOSAL | 0 refills | Status: DC | PRN

## 2016-09-01 NOTE — Progress Notes (Signed)
   CC: Foot pain, Smoking cessation  HPI:  [name] is a 40 y.o. man who presents today for discussion of smoking cessation and about his somewhat painful foot calluses. He was seen earlier this year in the ED for chest pain where a CTA was obtained that incidentally demonstrated a solitar pulmonary nodule. He was subsequently seen in pulmonology clinic. He had quit smoking in 2016 but resumed and is currently smoking about 0.5-1 pack per day. He is interested in quitting again and has started trying to reduce this with nicotine patches that have helped in the past. He has also noticed increasingly large callus formation along the medial aspect of his 1st MTP and great toe on both feet. These have been present for months but have just become painful within the past few weeks and now bother him most when standing still for a prolonged period.  See problem bases assessment and plan below for additional details.  Past Medical History:  Diagnosis Date  . Allergy    Seasonal allergic rhinitis  . Chest pain 04/2016  . HLD (hyperlipidemia)     Review of Systems:  Review of Systems  Constitutional: Negative for weight loss.  HENT: Negative for congestion.   Respiratory: Negative for cough and shortness of breath.   Cardiovascular: Negative for chest pain, palpitations and leg swelling.  Musculoskeletal: Negative for joint pain.  Skin: Negative for rash.  Neurological: Negative for speech change.  Endo/Heme/Allergies: Negative for environmental allergies.    Physical Exam:  Vitals:   09/01/16 1449  BP: 133/75  Pulse: 78  Temp: 98.4 F (36.9 C)  TempSrc: Oral  SpO2: 100%  Weight: 197 lb 14.4 oz (89.8 kg)  Height: 6' (1.829 m)   GENERAL- alert, co-operative, NAD HEENT- Atraumatic, oral mucosa appears moist, good and intact dentition, no cervical LN enlargement.  CARDIAC- RRR, no murmurs RESP- CTAB, no wheezes or crackles EXTREMITIES- pulse 2+, symmetric, no pedal edema,  discolored calluses present on medial surfaces of great toes bilaterally at MCP and DIP, there may be underlying bunion deformity as well SKIN- Warm, dry, No rash or lesion PSYCH- Normal mood and affect, appropriate thought content and speech    Assessment & Plan:   See Encounters Tab for problem based charting.  Patient discussed with Dr. Oswaldo DoneVincent

## 2016-09-01 NOTE — Patient Instructions (Signed)
It was a pleasure to see you and your son today.  I recommend starting Buproprion (Zyban, Wellbutrin) 1 pill daily for about 2 weeks then increasing to twice daily when trying to quit smoking. This medicine can be helpful when taken up to 3 months to decrease amount of smoking.  I also referred you to [facility] for evaluation of your foot calluses. They can likely recommend appropriate footwear adjustments since you are very active daily.  I will plan to see you again in about 6 months or sooner if you have any new problems or questions.

## 2016-09-05 NOTE — Assessment & Plan Note (Signed)
A: He is nondiabetic with good appearing feet otherwise so I anticipated no risk of major complications. I recommended he may need different work boots or possible shoe inserts to stop these from getting worse. He has no objection to a podiatry referral.  P: -Ambulatory referral to [facility]

## 2016-09-05 NOTE — Assessment & Plan Note (Signed)
A: He seems motivated towards quitting smoking again particularly after incidental findings of an isolated pulmonary nodule and discussion at pulmonology clinic. He is currently using the nicotine patch but still smoking up to 1/2 pack per day. He thinks that part of it is force of habit since he finds himself smoking when idle but not when he is busy. Without major medical problems and aged 40, tobacco use is his largest health risk at this time  P: -Prescribed Buproprion 150mg . Instructed him to start taking this medicine once daily and choose a full quit date within 1-2 weeks then increase to BID dosing when he quits. Prescription provided for full 3 month course at this time -Will also prescribe nicotine gum as an option for an alternative that he wants to try

## 2016-09-06 ENCOUNTER — Encounter: Payer: Self-pay | Admitting: Internal Medicine

## 2016-09-06 NOTE — Progress Notes (Signed)
Internal Medicine Clinic Attending  Case discussed with Dr. Rice at the time of the visit.  We reviewed the resident's history and exam and pertinent patient test results.  I agree with the assessment, diagnosis, and plan of care documented in the resident's note.  

## 2016-09-26 ENCOUNTER — Ambulatory Visit: Payer: Medicaid Other | Admitting: Sports Medicine

## 2016-09-26 ENCOUNTER — Encounter: Payer: Self-pay | Admitting: Internal Medicine

## 2016-10-17 ENCOUNTER — Ambulatory Visit: Payer: Medicaid Other | Admitting: Sports Medicine

## 2016-10-28 DIAGNOSIS — Z79899 Other long term (current) drug therapy: Secondary | ICD-10-CM | POA: Insufficient documentation

## 2016-10-28 DIAGNOSIS — J209 Acute bronchitis, unspecified: Secondary | ICD-10-CM | POA: Insufficient documentation

## 2016-10-28 DIAGNOSIS — F17210 Nicotine dependence, cigarettes, uncomplicated: Secondary | ICD-10-CM | POA: Diagnosis not present

## 2016-10-28 DIAGNOSIS — R071 Chest pain on breathing: Secondary | ICD-10-CM | POA: Diagnosis present

## 2016-10-29 ENCOUNTER — Emergency Department (HOSPITAL_COMMUNITY)
Admission: EM | Admit: 2016-10-29 | Discharge: 2016-10-29 | Disposition: A | Discharge status: Home / Self Care | Payer: Medicaid Other | Attending: Emergency Medicine | Admitting: Emergency Medicine

## 2016-10-29 ENCOUNTER — Encounter (HOSPITAL_COMMUNITY): Payer: Self-pay | Admitting: Oncology

## 2016-10-29 ENCOUNTER — Emergency Department (HOSPITAL_COMMUNITY): Payer: Medicaid Other

## 2016-10-29 DIAGNOSIS — J209 Acute bronchitis, unspecified: Secondary | ICD-10-CM

## 2016-10-29 LAB — BASIC METABOLIC PANEL
Anion gap: 8 (ref 5–15)
BUN: 8 mg/dL (ref 6–20)
CO2: 26 mmol/L (ref 22–32)
Calcium: 9.4 mg/dL (ref 8.9–10.3)
Chloride: 103 mmol/L (ref 101–111)
Creatinine, Ser: 0.87 mg/dL (ref 0.61–1.24)
GFR calc Af Amer: >60 mL/min (ref >60)
GFR calc non Af Amer: >60 mL/min (ref >60)
Glucose, Bld: 92 mg/dL (ref 65–99)
Potassium: 3.7 mmol/L (ref 3.5–5.1)
Sodium: 137 mmol/L (ref 135–145)

## 2016-10-29 LAB — CBC
HCT: 41.5 % (ref 39.0–52.0)
Hemoglobin: 14.4 g/dL (ref 13.0–17.0)
MCH: 30.3 pg (ref 26.0–34.0)
MCHC: 34.7 g/dL (ref 30.0–36.0)
MCV: 87.2 fL (ref 78.0–100.0)
Platelets: 263 10*3/uL (ref 150–400)
RBC: 4.76 MIL/uL (ref 4.22–5.81)
RDW: 12.9 % (ref 11.5–15.5)
WBC: 6.8 10*3/uL (ref 4.0–10.5)

## 2016-10-29 LAB — I-STAT TROPONIN, ED
Troponin i, poc: 0 ng/mL (ref 0.00–0.08)
Troponin i, poc: 0 ng/mL (ref 0.00–0.08)

## 2016-10-29 MED ORDER — ALBUTEROL SULFATE (2.5 MG/3ML) 0.083% IN NEBU
5.0000 mg | INHALATION_SOLUTION | Freq: Once | RESPIRATORY_TRACT | Status: AC
  Administered 2016-10-29: 5 mg via RESPIRATORY_TRACT
  Filled 2016-10-29: qty 6

## 2016-10-29 MED ORDER — ALBUTEROL SULFATE HFA 108 (90 BASE) MCG/ACT IN AERS
2.0000 | INHALATION_SPRAY | Freq: Once | RESPIRATORY_TRACT | Status: AC
  Administered 2016-10-29: 2 via RESPIRATORY_TRACT
  Filled 2016-10-29: qty 6.7

## 2016-10-29 MED ORDER — ASPIRIN 81 MG PO CHEW
324.0000 mg | CHEWABLE_TABLET | Freq: Once | ORAL | Status: AC
  Administered 2016-10-29: 324 mg via ORAL
  Filled 2016-10-29: qty 4

## 2016-10-29 NOTE — ED Provider Notes (Signed)
[facility]-EMERGENCY DEPT Provider Note   CSN: [id number] Arrival date & time: 10/28/16  2356    By signing my name below, I, Valentino Saxon, attest that this documentation has been prepared under the direction and in the presence of Pricilla Loveless, MD. Electronically Signed: Valentino Saxon, ED Scribe. 10/29/16. 12:40 AM.  History   Chief Complaint Chief Complaint  Patient presents with  . Chest Pain   The history is provided by the patient. No language interpreter was used.   HPI Comments: Keith Green is a 41 y.o. male who presents to the Emergency Department complaining of moderate, intermittent, generalized chest pain accompanied by constant SOB onset four days ago. Pt describes his chest pain as a "soreness". He notes his pain is exacerbated with deep breathing. Pt also reports having a non-persistent dry cough, chills and subjective fever that began yesterday followed by a sore throat, generalized body aches that started today. Pt's temperature in the ED was 97.8. He reports taking ibuprofen with minimal relief. Pt denies leg swelling. He also denies recent long travels.   Past Medical History:  Diagnosis Date  . Allergy    Seasonal allergic rhinitis  . Chest pain 04/2016  . HLD (hyperlipidemia)     Patient Active Problem List   Diagnosis Date Noted  . Callus of foot 09/01/2016  . Chest pain 05/18/2016  . LVH (left ventricular hypertrophy)   . Abnormal ECG   . Encounter for smoking cessation counseling   . Left knee pain 05/12/2016  . Health care maintenance 12/15/2015  . TOBACCO ABUSE 10/12/2010    Past Surgical History:  Procedure Laterality Date  . KNEE ARTHROSCOPY Left        Home Medications    Prior to Admission medications   Medication Sig Start Date End Date Taking? Authorizing Provider  buPROPion (WELLBUTRIN SR) 150 MG 12 hr tablet Take 1 tablet (150 mg total) by mouth 2 (two) times daily. Patient not taking: Reported on 10/29/2016 09/01/16 11/30/16   Fuller Plan, MD  cetirizine (ZYRTEC) 10 MG tablet Take 1 tablet (10 mg total) by mouth daily. Patient not taking: Reported on 10/29/2016 12/15/15   Hyacinth Meeker, MD  nicotine (NICODERM CQ - DOSED IN MG/24 HOURS) 14 mg/24hr patch Place 1 patch (14 mg total) onto the skin daily. Patient not taking: Reported on 10/29/2016 12/15/15   Hyacinth Meeker, MD  nicotine polacrilex (NICORETTE) 4 MG gum Take 1 each (4 mg total) by mouth as needed for smoking cessation. Patient not taking: Reported on 10/29/2016 09/01/16   Fuller Plan, MD    Family History Family History  Problem Relation Age of Onset  . Hypertension Mother     Social History Social History  Substance Use Topics  . Smoking status: Current Every Day Smoker    Packs/day: 1.00    Years: 25.00    Types: Cigarettes  . Smokeless tobacco: Never Used     Comment: down to 5-8 cigarettes daily 07/19/16  . Alcohol use No     Allergies   Patient has no known allergies.   Review of Systems Review of Systems  Constitutional: Positive for chills and fever.  Respiratory: Positive for cough and shortness of breath.   Cardiovascular: Positive for chest pain. Negative for leg swelling.  Gastrointestinal: Positive for nausea.  Musculoskeletal: Positive for myalgias.  All other systems reviewed and are negative.    Physical Exam Updated Vital Signs BP 118/82   Pulse 66   Temp 97.8 F (36.6 C) (  Oral)   Resp 17   SpO2 100%   Physical Exam  Constitutional: He is oriented to person, place, and time. He appears well-developed and well-nourished. No distress.  HENT:  Head: Normocephalic and atraumatic.  Right Ear: External ear normal.  Left Ear: External ear normal.  Nose: Nose normal.  Eyes: Right eye exhibits no discharge. Left eye exhibits no discharge.  Neck: Neck supple.  Cardiovascular: Normal rate, regular rhythm and normal heart sounds.   Pulmonary/Chest: Effort normal. He has wheezes. He exhibits no tenderness.  Mild  expiratory wheezes with mildly decreased breath sounds.   Abdominal: Soft. There is no tenderness.  Musculoskeletal: He exhibits no edema.  Neurological: He is alert and oriented to person, place, and time.  Skin: Skin is warm and dry. He is not diaphoretic.  Nursing note and vitals reviewed.    ED Treatments / Results   COORDINATION OF CARE: 12:37 AM Discussed treatment plan with pt at bedside which includes labs, chest imaging, EKG and pt agreed to plan.   Labs (all labs ordered are listed, but only abnormal results are displayed) Labs Reviewed  BASIC METABOLIC PANEL  CBC  I-STAT TROPOININ, ED  I-STAT TROPOININ, ED    EKG  EKG Interpretation  Date/Time:  Sunday October 29 2016 00:04:51 EST Ventricular Rate:  68 PR Interval:    QRS Duration: 105 QT Interval:  397 QTC Calculation: 423 R Axis:   -67 Text Interpretation:  Sinus rhythm Incomplete RBBB and LAFB ST elev, probable normal early repol pattern ST elevation anteriorly similar to Aug 2017 and June 2014 Confirmed by Criss Alvine MD, Leliana Kontz [phone]) on 10/29/2016 12:12:16 AM       EKG Interpretation  Date/Time:  Sunday October 29 2016 02:45:47 EST Ventricular Rate:  59 PR Interval:    QRS Duration: 120 QT Interval:  426 QTC Calculation: 422 R Axis:   -59 Text Interpretation:  Sinus rhythm Prolonged PR interval Incomplete RBBB and LAFB Left ventricular hypertrophy ST elev, probable normal early repol pattern no significant change since earlier in the day Confirmed by Keyana Guevara MD, Avanish Cerullo [phone]) on 10/29/2016 3:32:34 AM        Radiology Dg Chest 2 View  Result Date: 10/29/2016 CLINICAL DATA:  Dyspnea and generalized chest pain. Nausea and diaphoresis for 3 days. EXAM: CHEST  2 VIEW COMPARISON:  05/17/2016 FINDINGS: The heart size and mediastinal contours are within normal limits. Both lungs are clear. The visualized skeletal structures are unremarkable. IMPRESSION: No active cardiopulmonary disease. Electronically  Signed   By: Ellery Plunk M.D.   On: 10/29/2016 00:47    Procedures Procedures (including critical care time)  Medications Ordered in ED Medications  aspirin chewable tablet 324 mg (324 mg Oral Given 10/29/16 0107)  albuterol (PROVENTIL) (2.5 MG/3ML) 0.083% nebulizer solution 5 mg (5 mg Nebulization Given 10/29/16 0120)  albuterol (PROVENTIL HFA;VENTOLIN HFA) 108 (90 Base) MCG/ACT inhaler 2 puff (2 puffs Inhalation Given 10/29/16 0303)     Initial Impression / Assessment and Plan / ED Course  I have reviewed the triage vital signs and the nursing notes.  Pertinent labs & imaging results that were available during my care of the patient were reviewed by me and considered in my medical decision making (see chart for details).  Clinical Course as of Oct 30 1515  Wynelle Link Oct 29, 2016  0041 Patient most likely has a bronchitis. Will give albuterol. Labs, CXR. Doubt PE.   [SG]    Clinical Course User Index [SG] Pricilla Loveless, MD  Patient appears to have bronchitis. Chest pain is likely from tightness or chest wall. I think PE, dissection and ACS are all unlikely. PERC negative, no PE risk factors. ECG unchanged. 2 negative troponins. Feels much better after albuterol, will give inhaler for symptomatic relief at home. Stable for discharge, discussed need for PCP f/u and strict return precautions.  Final Clinical Impressions(s) / ED Diagnoses   Final diagnoses:  Acute bronchitis, unspecified organism    New Prescriptions Discharge Medication List as of 10/29/2016  6:01 AM      I personally performed the services described in this documentation, which was scribed in my presence. The recorded information has been reviewed and is accurate.     Pricilla LovelessScott Jafeth Mustin, MD 10/29/16 [phone]

## 2016-10-29 NOTE — ED Triage Notes (Signed)
Pt c/o Shob, generalized CP, nausea, diaphoresis x 3 days.  Pt rates pain 9/10, aching in nature.

## 2016-10-31 ENCOUNTER — Ambulatory Visit: Payer: Medicaid Other | Admitting: Sports Medicine

## 2016-11-10 ENCOUNTER — Encounter: Payer: Self-pay | Admitting: Podiatry

## 2016-11-10 ENCOUNTER — Ambulatory Visit (INDEPENDENT_AMBULATORY_CARE_PROVIDER_SITE_OTHER): Payer: Medicaid Other | Admitting: Podiatry

## 2016-11-10 ENCOUNTER — Ambulatory Visit (INDEPENDENT_AMBULATORY_CARE_PROVIDER_SITE_OTHER): Payer: Medicaid Other

## 2016-11-10 VITALS — Resp 16 | Ht 72.0 in | Wt 195.0 lb

## 2016-11-10 DIAGNOSIS — M7750 Other enthesopathy of unspecified foot: Secondary | ICD-10-CM | POA: Diagnosis not present

## 2016-11-10 DIAGNOSIS — L84 Corns and callosities: Secondary | ICD-10-CM

## 2016-11-10 DIAGNOSIS — M7751 Other enthesopathy of right foot: Secondary | ICD-10-CM

## 2016-11-10 DIAGNOSIS — R52 Pain, unspecified: Secondary | ICD-10-CM

## 2016-11-10 DIAGNOSIS — M779 Enthesopathy, unspecified: Secondary | ICD-10-CM

## 2016-11-10 NOTE — Progress Notes (Signed)
   Subjective:    Patient ID: Keith LedererStephen Fenstermaker, male    DOB: 1976-05-21, 41 y.o.   MRN: [id number]  HPI  Chief Complaint  Patient presents with  . Foot Pain    BL; Plantar Forefoot and Bottom of heel x 8 months.   . Callouses    BL; Medial sides.        Review of Systems     Objective:   Physical Exam        Assessment & Plan:

## 2016-11-13 NOTE — Progress Notes (Signed)
Subjective:     Patient ID: Tonette LedererStephen Lalani, male   DOB: 1976-04-23, 41 y.o.   MRN: [id number]  HPI patient presents stating that he has severe callus formation plantar aspect of both forefoot and heels and it makes it hard to be ambulatory   Review of Systems  All other systems reviewed and are negative.      Objective:   Physical Exam  Constitutional: He is oriented to person, place, and time.  Cardiovascular: Intact distal pulses.   Musculoskeletal: Normal range of motion.  Neurological: He is oriented to person, place, and time.  Skin: Skin is warm.  Nursing note and vitals reviewed.  neurovascular status intact muscle strength adequate range of motion the normal limits with severe plantar keratotic lesion sub-first fifth metatarsal bilateral with high arch foot type and pain with palpation. 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

## 2016-12-06 ENCOUNTER — Ambulatory Visit: Payer: Medicaid Other

## 2016-12-25 ENCOUNTER — Other Ambulatory Visit: Payer: Medicaid Other

## 2017-01-01 ENCOUNTER — Other Ambulatory Visit: Payer: Medicaid Other

## 2017-01-25 ENCOUNTER — Other Ambulatory Visit: Payer: Medicaid Other

## 2017-02-10 ENCOUNTER — Emergency Department (HOSPITAL_COMMUNITY)
Admission: EM | Admit: 2017-02-10 | Discharge: 2017-02-10 | Disposition: A | Discharge status: Home / Self Care | Payer: Medicaid Other | Attending: Emergency Medicine | Admitting: Emergency Medicine

## 2017-02-10 ENCOUNTER — Encounter (HOSPITAL_COMMUNITY): Payer: Self-pay | Admitting: Emergency Medicine

## 2017-02-10 ENCOUNTER — Emergency Department (HOSPITAL_COMMUNITY): Payer: Medicaid Other

## 2017-02-10 DIAGNOSIS — J069 Acute upper respiratory infection, unspecified: Secondary | ICD-10-CM | POA: Diagnosis not present

## 2017-02-10 DIAGNOSIS — F17210 Nicotine dependence, cigarettes, uncomplicated: Secondary | ICD-10-CM | POA: Insufficient documentation

## 2017-02-10 DIAGNOSIS — Z79899 Other long term (current) drug therapy: Secondary | ICD-10-CM | POA: Insufficient documentation

## 2017-02-10 DIAGNOSIS — R05 Cough: Secondary | ICD-10-CM | POA: Diagnosis present

## 2017-02-10 MED ORDER — FLUTICASONE PROPIONATE 50 MCG/ACT NA SUSP: 2.0000 | Freq: Every day | NASAL | 0 refills | Status: DC

## 2017-02-10 MED ORDER — BENZONATATE 100 MG PO CAPS: 100.0000 mg | ORAL_CAPSULE | Freq: Three times a day (TID) | ORAL | 0 refills | Status: DC | PRN

## 2017-02-10 MED ORDER — ACETAMINOPHEN 325 MG PO TABS
650.0000 mg | ORAL_TABLET | Freq: Once | ORAL | Status: AC | PRN
  Administered 2017-02-10: 650 mg via ORAL
  Filled 2017-02-10: qty 2

## 2017-02-10 MED ORDER — DM-GUAIFENESIN ER 30-600 MG PO TB12: 1.0000 | ORAL_TABLET | Freq: Two times a day (BID) | ORAL | 0 refills | Status: DC | PRN

## 2017-02-10 NOTE — ED Triage Notes (Addendum)
Pt reports productive cough since yesterday which is causing CP, SOB, and HA during coughing spells. Has also had sore throat and runny nose.

## 2017-02-10 NOTE — ED Provider Notes (Signed)
[facility]-EMERGENCY DEPT Provider Note   CSN: [id number] Arrival date & time: 02/10/17  1455   By signing my name below, I, [name], attest that this documentation has been prepared under the direction and in the presence of Rebeca Valdivia, [place]. Electronically Signed: [name], Scribe. 02/10/2017. 6:08 PM.   History   Chief Complaint Chief Complaint  Patient presents with  . Cough    The history is provided by the patient. No language interpreter was used.     HPI Comments:  Keith Green is a 41 y.o. male who presents to the Emergency Department complaining of a productive cough which began yesterday. He reports associated HA, and CP secondary to cough,  lower back pain, chills mild sore throat, and nasal congestion. He states his symptoms worsened last night but have improved throughout the day today. He has taken ibuprofen without relief. Pt notes sick contacts at home with similar symptoms--7419 month old son.  No fever, nausea, vomiting, diarrhea, or abdominal pain. No h/o COPD or asthma. Admits to smoking.   Past Medical History:  Diagnosis Date  . Allergy    Seasonal allergic rhinitis  . Chest pain 04/2016  . HLD (hyperlipidemia)     Patient Active Problem List   Diagnosis Date Noted  . Callus of foot 09/01/2016  . Chest pain 05/18/2016  . LVH (left ventricular hypertrophy)   . Abnormal ECG   . Encounter for smoking cessation counseling   . Left knee pain 05/12/2016  . Health care maintenance 12/15/2015  . TOBACCO ABUSE 10/12/2010    Past Surgical History:  Procedure Laterality Date  . KNEE ARTHROSCOPY Left        Home Medications    Prior to Admission medications   Medication Sig Start Date End Date Taking? Authorizing Provider  benzonatate (TESSALON) 100 MG capsule Take 1 capsule (100 mg total) by mouth 3 (three) times daily as needed for cough. 02/10/17   Alvina ChouEspina, Vanesha [place] Manuel, PA  buPROPion (WELLBUTRIN SR) 150 MG 12 hr tablet Take 1  tablet (150 mg total) by mouth 2 (two) times daily. Patient not taking: Reported on 10/29/2016 09/01/16 11/30/16  Fuller Planice, Christopher W, MD  cetirizine (ZYRTEC) 10 MG tablet Take 1 tablet (10 mg total) by mouth daily. Patient not taking: Reported on 10/29/2016 12/15/15   Hyacinth MeekerAhmed, Tasrif, MD  dextromethorphan-guaiFENesin [facility](MUCINEX DM) 30-600 MG 12hr tablet Take 1 tablet by mouth 2 (two) times daily as needed for cough. 02/10/17   Xaden Kaufman, Lucita LoraFrancisco Manuel, PA  fluticasone (FLONASE) 50 MCG/ACT nasal spray Place 2 sprays into both nostrils daily. 02/10/17   Lexxi Koslow, Lucita LoraFrancisco Manuel, PA  nicotine (NICODERM CQ - DOSED IN MG/24 HOURS) 14 mg/24hr patch Place 1 patch (14 mg total) onto the skin daily. Patient not taking: Reported on 10/29/2016 12/15/15   Hyacinth MeekerAhmed, Tasrif, MD  nicotine polacrilex (NICORETTE) 4 MG gum Take 1 each (4 mg total) by mouth as needed for smoking cessation. Patient not taking: Reported on 10/29/2016 09/01/16   Fuller Planice, Christopher W, MD    Family History Family History  Problem Relation Age of Onset  . Hypertension Mother     Social History Social History  Substance Use Topics  . Smoking status: Current Every Day Smoker    Packs/day: 1.00    Years: 25.00    Types: Cigarettes  . Smokeless tobacco: Never Used     Comment: down to 5-8 cigarettes daily 07/19/16  . Alcohol use No     Allergies   Patient has no known  allergies.   Review of Systems Review of Systems  Constitutional: Positive for chills. Negative for fever.  HENT: Positive for congestion and sore throat.   Respiratory: Positive for cough. Negative for shortness of breath.   Cardiovascular: Positive for chest pain.  Neurological: Positive for headaches.     Physical Exam Updated Vital Signs BP 139/81 (BP Location: Left Arm)   Pulse 86   Temp 98.4 F (36.9 C) (Oral) Comment: has taken ibuprofen today  Resp 16   SpO2 98%   Physical Exam  Constitutional: He is oriented to person, place, and time. He appears  well-developed and well-nourished.  Well appearing  HENT:  Head: Normocephalic and atraumatic.  Right Ear: External ear normal.  Left Ear: External ear normal.  Nose: Nose normal.  Mouth/Throat: Oropharynx is clear and moist. No oropharyngeal exudate.  Oropharynx without evidence of redness or exudates. Tonsils without evidence of redness, swelling, or exudates. TM's appear normal with no evidence of bulging. EAC appear non erythematous and not swollen  Eyes: EOM are normal. Pupils are equal, round, and reactive to light.  Neck: Normal range of motion.  Normal ROM. No nuchal rigidity.   Cardiovascular: Normal rate and normal heart sounds.   Pulmonary/Chest: Effort normal. No respiratory distress. He has wheezes. He has no rales.  Mild expiratory wheezes. No rales. No stridor. Normal work of breathing  Abdominal: Soft. There is no tenderness. There is no rebound and no guarding.  Soft and nontender. No rebound. No guarding. Negative murphy's sign. No focal tenderness at McBurney's point. No CVA tenderness. No evidence of hernia  Neurological: He is alert and oriented to person, place, and time.  Skin: Skin is warm.  Psychiatric: He has a normal mood and affect. His behavior is normal.  Nursing note and vitals reviewed.    ED Treatments / Results  DIAGNOSTIC STUDIES:  Oxygen Saturation is 99% on RA, normal by my interpretation.    COORDINATION OF CARE:  6:05 PM Discussed treatment plan with pt at bedside and pt agreed to plan.  Labs (all labs ordered are listed, but only abnormal results are displayed) Labs Reviewed - No data to display  EKG  EKG Interpretation None       Radiology Dg Chest 2 View  Result Date: 02/10/2017 CLINICAL DATA:  Productive cough since yesterday which shortness-of-breath and chest pain. Headache. Also complains of sore throat and runny nose. EXAM: CHEST  2 VIEW COMPARISON:  10/29/2016 and 05/17/2016 FINDINGS: Lungs are adequately inflated without  focal airspace consolidation or effusion. Cardiomediastinal silhouette is within normal. Bones and soft tissues are unremarkable. IMPRESSION: No active cardiopulmonary disease. Electronically Signed   By: Elberta Fortis M.D.   On: 02/10/2017 18:17    Procedures Procedures (including critical care time)  Medications Ordered in ED Medications  acetaminophen (TYLENOL) tablet 650 mg (650 mg Oral Given 02/10/17 1730)     Initial Impression / Assessment and Plan / ED Course  I have reviewed the triage vital signs and the nursing notes.  Pertinent labs & imaging results that were available during my care of the patient were reviewed by me and considered in my medical decision making (see chart for details).      Pt symptoms consistent with URI. CXR negative for acute infiltrate. Pt will be discharged with symptomatic treatment.  Discussed return precautions.  Pt is hemodynamically stable & in NAD prior to discharge.   Final Clinical Impressions(s) / ED Diagnoses   Final diagnoses:  Viral upper respiratory  tract infection    New Prescriptions Discharge Medication List as of 02/10/2017  6:26 PM    START taking these medications   Details  benzonatate (TESSALON) 100 MG capsule Take 1 capsule (100 mg total) by mouth 3 (three) times daily as needed for cough., Starting Sat 02/10/2017, Print    dextromethorphan-guaiFENesin (MUCINEX DM) 30-600 MG 12hr tablet Take 1 tablet by mouth 2 (two) times daily as needed for cough., Starting Sat 02/10/2017, Print    fluticasone (FLONASE) 50 MCG/ACT nasal spray Place 2 sprays into both nostrils daily., Starting Sat 02/10/2017, Print       I personally performed the services described in this documentation, which was scribed in my presence. The recorded information has been reviewed and is accurate.    Candie Mile [place], [place] 02/10/17 1910    Tilden Fossa, MD 02/11/17 1447

## 2017-02-10 NOTE — Discharge Instructions (Signed)
1. Medications: flonase, mucinex, tessalon, usual home medications 2. Treatment: rest, drink plenty of fluids, take tylenol or ibuprofen for fever control 3. Follow Up: Please followup with your primary doctor in 3 days for discussion of your diagnoses and further evaluation after today's visit; if you do not have a primary care doctor use the resource guide provided to find one; Return to the ER for high fevers, difficulty breathing or other concerning symptoms   Contact a health care provider if: You are getting worse rather than better. Your symptoms are not controlled by medicine. You have chills. You have worsening shortness of breath. You have brown or red mucus. You have yellow or brown nasal discharge. You have pain in your face, especially when you bend forward. You have a fever. You have swollen neck glands. You have pain while swallowing. You have white areas in the back of your throat. Get help right away if: You have severe or persistent: Headache. Ear pain. Sinus pain. Chest pain. You have chronic lung disease and any of the following: Wheezing. Prolonged cough. Coughing up blood. A change in your usual mucus. You have a stiff neck. You have changes in your: Vision. Hearing. Thinking. Mood.

## 2017-02-15 ENCOUNTER — Other Ambulatory Visit: Payer: Medicaid Other

## 2017-02-26 ENCOUNTER — Encounter: Payer: Medicaid Other | Admitting: Orthotics

## 2017-05-21 ENCOUNTER — Inpatient Hospital Stay: Admission: RE | Admit: 2017-05-21 | Payer: Medicaid Other | Source: Ambulatory Visit

## 2017-06-04 ENCOUNTER — Inpatient Hospital Stay: Admission: RE | Admit: 2017-06-04 | Payer: Medicaid Other | Source: Ambulatory Visit

## 2017-06-12 ENCOUNTER — Ambulatory Visit (INDEPENDENT_AMBULATORY_CARE_PROVIDER_SITE_OTHER)
Admission: RE | Admit: 2017-06-12 | Discharge: 2017-06-12 | Disposition: A | Discharge status: Home / Self Care | Payer: Medicaid Other | Source: Ambulatory Visit | Attending: Pulmonary Disease | Admitting: Pulmonary Disease

## 2017-06-12 DIAGNOSIS — R911 Solitary pulmonary nodule: Secondary | ICD-10-CM

## 2017-06-21 ENCOUNTER — Other Ambulatory Visit: Payer: Self-pay

## 2017-06-21 DIAGNOSIS — R911 Solitary pulmonary nodule: Secondary | ICD-10-CM

## 2017-06-21 DIAGNOSIS — R918 Other nonspecific abnormal finding of lung field: Secondary | ICD-10-CM

## 2017-08-23 ENCOUNTER — Ambulatory Visit: Payer: Medicaid Other

## 2017-08-23 ENCOUNTER — Encounter: Payer: Self-pay | Admitting: Internal Medicine

## 2017-08-29 ENCOUNTER — Ambulatory Visit: Payer: Medicaid Other

## 2017-09-04 ENCOUNTER — Ambulatory Visit: Payer: Medicaid Other

## 2017-09-07 ENCOUNTER — Ambulatory Visit: Payer: Medicaid Other | Admitting: Internal Medicine

## 2017-09-07 ENCOUNTER — Other Ambulatory Visit: Payer: Self-pay

## 2017-09-07 ENCOUNTER — Encounter: Payer: Self-pay | Admitting: Internal Medicine

## 2017-09-07 VITALS — BP 128/82 | HR 77 | Temp 98.2°F | Ht 72.0 in | Wt 199.1 lb

## 2017-09-07 DIAGNOSIS — M25511 Pain in right shoulder: Secondary | ICD-10-CM

## 2017-09-07 DIAGNOSIS — F17210 Nicotine dependence, cigarettes, uncomplicated: Secondary | ICD-10-CM | POA: Diagnosis not present

## 2017-09-07 DIAGNOSIS — J309 Allergic rhinitis, unspecified: Secondary | ICD-10-CM

## 2017-09-07 DIAGNOSIS — M25569 Pain in unspecified knee: Secondary | ICD-10-CM | POA: Diagnosis not present

## 2017-09-07 MED ORDER — FLUTICASONE PROPIONATE 50 MCG/ACT NA SUSP: 2.0000 | Freq: Every day | NASAL | 1 refills | Status: DC

## 2017-09-07 MED ORDER — CETIRIZINE HCL 10 MG PO TABS: 10.0000 mg | ORAL_TABLET | Freq: Every day | ORAL | 3 refills | Status: DC

## 2017-09-07 MED ORDER — NAPROXEN 500 MG PO TABS: 500.0000 mg | ORAL_TABLET | Freq: Two times a day (BID) | ORAL | 0 refills | Status: DC

## 2017-09-07 NOTE — Progress Notes (Signed)
   CC: Right shoulder pain, sinus congestion  HPI:  [name] is a 41 y.o. male with PMHx detailed below presenting with several months of right shoulder pain that bothers him with activity and also after sleeping on his right side for prolonged periods.  See problem based assessment and plan below for additional details.  Allergic rhinitis Sinus congestion has been ongoing for most of a month without much change. He denies sore throat, shortness of breath, wheezing, fevers, or sinus pain. He has joint pain but just in the knee and shoulder. He is using mucinex without a large benefit. Assessment I mostly suspect perennial allergic rhinitis or vasomotor rhinitis from the weather change. He has visible edematous nasal mucosa and cobblestoning of the posterior oropharynx. There is nothing to suggest a complicated upper airway infection. Plan Recommended starting fluticasone intranasal spray daily Start cetirizine daily  Arthralgia of right acromioclavicular joint This actually sounds like a chronic problem that has worsened again for the past few months. 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 He does not recall any trauma to the shoulder. He has no associated weakness. The pain is often worst waking up after sleeping lying on his right side. Assessment Exam is most consistent with acromioclavicular joint pain. He has pain on cross body adduction and not in classic rotator cuff movements. I suspect some degree of arthritis now aggravated by use from work.  Plan -Take oral NSAIDs on a schedule for 1-2 weeks then PRN -Instructed him on low intensity stretching with shoulder extension and external rotation -Recommended avoid heavy object lifting without assistance -If pain is worse or no better in 2-4 weeks plain film xray, ultrasound guided steroid injection, and maybe PT eval would be next options   Past  Medical History:  Diagnosis Date  . Allergy    Seasonal allergic rhinitis  . Chest pain 04/2016  . HLD (hyperlipidemia)     Review of Systems: Review of Systems  Constitutional: Negative for chills and fever.  HENT: Positive for congestion. Negative for hearing loss and sore throat.   Eyes: Negative for blurred vision.  Respiratory: Negative for cough.   Cardiovascular: Negative for chest pain.  Gastrointestinal: Negative for diarrhea and nausea.  Musculoskeletal: Positive for joint pain. Negative for back pain and neck pain.  Skin: Negative for rash.  Neurological: Negative for sensory change.     Physical Exam: Vitals:   09/07/17 1409  BP: 128/82  Pulse: 77  Temp: 98.2 F (36.8 C)  TempSrc: Oral  SpO2: 99%  Weight: 199 lb 1.6 oz (90.3 kg)  Height: 6' (1.829 m)   GENERAL- alert, co-operative, NAD HEENT- Some erythema and cobblestoning of posterior oropharynx, no cervical lymphadenopathy, pressure sensation to palpation of maxillary sinuses CARDIAC- RRR, no murmurs, rubs or gallops. RESP- CTAB, no wheezes or crackles. NEURO- Normal sensation and strength in R arm EXTREMITIES- [facility] provoked with cross body adduction, minor pain with  empty can, no painful arc, reduced strength, or reduced ROM   Assessment & Plan:   See encounters tab for problem based medical decision making.   Patient discussed with Dr. Sandre Kittyaines

## 2017-09-07 NOTE — Patient Instructions (Signed)
I recommend using flonase 2 sprays on each side daily and taking cetirizine once daily for your congestion symptoms. You can keep using these as long as you keep having symptoms.  I suspect you have some arthritis of the acromioclavicular joint. This can be treated conservatively with antiinflammatory medicine, heat or ice.  Stretching is also usually beneficial:  1) Extending your arms behind you with elbows fully extended.  2) Outward rotation of your arm with elbow flexed at 90 degrees tight against the body.  You can make these more challenging with use of a resistance band. Do not do them so hard that it causes significant pain.   If the symptoms are worse or no better in 2-4 weeks from now we can consider seeing this again with trial of a steroid injection and xray of the joint.

## 2017-09-10 NOTE — Assessment & Plan Note (Signed)
Sinus congestion has been ongoing for most of a month without much change. He denies sore throat, shortness of breath, wheezing, fevers, or sinus pain. He has joint pain but just in the knee and shoulder. He is using mucinex without a large benefit. Assessment I mostly suspect perennial allergic rhinitis or vasomotor rhinitis from the weather change. He has visible edematous nasal mucosa and cobblestoning of the posterior oropharynx. There is nothing to suggest a complicated upper airway infection. Plan Recommended starting fluticasone intranasal spray daily Start cetirizine daily

## 2017-09-10 NOTE — Assessment & Plan Note (Signed)
This actually sounds like a chronic problem that has worsened again for the past few months. 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 He does not recall any trauma to the shoulder. He has no associated weakness. The pain is often worst waking up after sleeping lying on his right side. Assessment Exam is most consistent with acromioclavicular joint pain. He has pain on cross body adduction and not in classic rotator cuff movements. I suspect some degree of arthritis now aggravated by use from work.  Plan -Take oral NSAIDs on a schedule for 1-2 weeks then PRN -Instructed him on low intensity stretching with shoulder extension and external rotation -Recommended avoid heavy object lifting without assistance -If pain is worse or no better in 2-4 weeks plain film xray, ultrasound guided steroid injection, and maybe PT eval would be next options

## 2017-09-11 NOTE — Progress Notes (Signed)
Internal Medicine Clinic Attending  Case discussed with Dr. Rice  at the time of the visit.  We reviewed the resident's history and exam and pertinent patient test results.  I agree with the assessment, diagnosis, and plan of care documented in the resident's note.  Mahika Vanvoorhis N Tayona Sarnowski, MD   

## 2017-09-23 ENCOUNTER — Other Ambulatory Visit: Payer: Self-pay | Admitting: Internal Medicine

## 2017-09-23 DIAGNOSIS — J309 Allergic rhinitis, unspecified: Secondary | ICD-10-CM

## 2017-10-08 ENCOUNTER — Telehealth: Payer: Self-pay | Admitting: Podiatry

## 2017-10-08 NOTE — Telephone Encounter (Signed)
Late entry from this morning..the patient called asking about his orthotics from last year states he never picked them up.  I looked for the orthotics and cannot find them. Th[facility]..  I left message for pt to call to discuss what he would like us to do.Marland Kitchen..Marland Kitchen

## 2017-12-17 ENCOUNTER — Other Ambulatory Visit: Payer: Self-pay | Admitting: Internal Medicine

## 2017-12-17 DIAGNOSIS — J309 Allergic rhinitis, unspecified: Secondary | ICD-10-CM

## 2018-04-01 ENCOUNTER — Encounter (HOSPITAL_COMMUNITY): Payer: Self-pay

## 2018-04-01 ENCOUNTER — Other Ambulatory Visit: Payer: Self-pay

## 2018-04-01 ENCOUNTER — Emergency Department (HOSPITAL_COMMUNITY)
Admission: EM | Admit: 2018-04-01 | Discharge: 2018-04-01 | Disposition: A | Discharge status: Home / Self Care | Payer: PRIVATE HEALTH INSURANCE | Attending: Emergency Medicine | Admitting: Emergency Medicine

## 2018-04-01 DIAGNOSIS — Z79899 Other long term (current) drug therapy: Secondary | ICD-10-CM | POA: Insufficient documentation

## 2018-04-01 DIAGNOSIS — M545 Low back pain: Secondary | ICD-10-CM | POA: Diagnosis present

## 2018-04-01 DIAGNOSIS — F17210 Nicotine dependence, cigarettes, uncomplicated: Secondary | ICD-10-CM | POA: Diagnosis not present

## 2018-04-01 DIAGNOSIS — J309 Allergic rhinitis, unspecified: Secondary | ICD-10-CM

## 2018-04-01 DIAGNOSIS — M5442 Lumbago with sciatica, left side: Secondary | ICD-10-CM | POA: Diagnosis not present

## 2018-04-01 MED ORDER — DEXAMETHASONE SODIUM PHOSPHATE 10 MG/ML IJ SOLN
10.0000 mg | Freq: Once | INTRAMUSCULAR | Status: AC
  Administered 2018-04-01: 10 mg via INTRAMUSCULAR
  Filled 2018-04-01: qty 1

## 2018-04-01 MED ORDER — CYCLOBENZAPRINE HCL 10 MG PO TABS: 10.0000 mg | ORAL_TABLET | Freq: Two times a day (BID) | ORAL | 0 refills | Status: AC | PRN

## 2018-04-01 MED ORDER — NAPROXEN 500 MG PO TABS: 500.0000 mg | ORAL_TABLET | Freq: Two times a day (BID) | ORAL | 0 refills | Status: DC

## 2018-04-01 MED FILL — CYCLOBENZAPRINE HCL 10 MG T: 10 | 10 days supply | Qty: 20 | Fill #0

## 2018-04-01 MED FILL — NAPROXEN 500 MG TABLET: 500 | 15 days supply | Qty: 30 | Fill #0

## 2018-04-01 NOTE — ED Notes (Signed)
RX X 2 GIVEN AND WORK NOTE

## 2018-04-01 NOTE — ED Provider Notes (Signed)
[facility]-EMERGENCY DEPT Provider Note   CSN: [id number] Arrival date & time: 04/01/18  4098     History   Chief Complaint Chief Complaint  Patient presents with  . Back Pain    HPI Keith Green is a 42 y.o. male.  Patient is a 42 year old male who presents with back pain.  He states he does a lot of heavy lifting at work but denies any specific injury.  He states is been hurting for the last 4 to 5 days.  He describes a achy pain across his low back that starts in the middle and radiates to the sides.  He has some mild radiation down his left leg.  It is worse when he sits or lies down.  He denies any numbness or weakness to his legs.  No loss of bowel or bladder control.  No known fevers.  No associated abdominal pain.  He had a history of similar symptoms about 3 years ago but states it did not last as long.  He has not taken anything at home for the pain.     Past Medical History:  Diagnosis Date  . Allergy    Seasonal allergic rhinitis  . Chest pain 04/2016  . HLD (hyperlipidemia)     Patient Active Problem List   Diagnosis Date Noted  . Arthralgia of right acromioclavicular joint 09/07/2017  . Callus of foot 09/01/2016  . Chest pain 05/18/2016  . LVH (left ventricular hypertrophy)   . Abnormal ECG   . Encounter for smoking cessation counseling   . Left knee pain 05/12/2016  . Allergic rhinitis 12/15/2015  . Health care maintenance 12/15/2015  . TOBACCO ABUSE 10/12/2010    Past Surgical History:  Procedure Laterality Date  . KNEE ARTHROSCOPY Left         Home Medications    Prior to Admission medications   Medication Sig Start Date End Date Taking? Authorizing Provider  cetirizine (ZYRTEC) 10 MG tablet Take 1 tablet (10 mg total) by mouth daily. 09/07/17   Rice, Jamesetta Orleans, MD  cyclobenzaprine (FLEXERIL) 10 MG tablet Take 1 tablet (10 mg total) by mouth 2 (two) times daily as needed for muscle spasms. 04/01/18   Rolan Bucco,  MD  fluticasone (FLONASE) 50 MCG/ACT nasal spray SPRAY 2 SPRAYS INTO EACH NOSTRIL EVERY DAY 12/17/17   Rice, Jamesetta Orleans, MD  naproxen (NAPROSYN) 500 MG tablet Take 1 tablet (500 mg total) by mouth 2 (two) times daily with a meal. 04/01/18   Rolan Bucco, MD    Family History Family History  Problem Relation Age of Onset  . Hypertension Mother     Social History Social History   Tobacco Use  . Smoking status: Current Some Day Smoker    Packs/day: 1.00    Years: 25.00    Pack years: 25.00    Types: Cigarettes  . Smokeless tobacco: Never Used  . Tobacco comment: down to 5-8 cigarettes daily 07/19/16  Substance Use Topics  . Alcohol use: No    Alcohol/week: 0.0 oz  . Drug use: No     Allergies   Patient has no known allergies.   Review of Systems Review of Systems  Constitutional: Negative for fever.  Gastrointestinal: Negative for nausea and vomiting.  Musculoskeletal: Positive for back pain. Negative for arthralgias, joint swelling and neck pain.  Skin: Negative for wound.  Neurological: Negative for weakness, numbness and headaches.     Physical Exam Updated Vital Signs BP (!) 148/107 (BP Location:  Right Arm)   Pulse 68   Temp 98.2 F (36.8 C) (Oral)   Resp 18   Ht 6' (1.829 m)   Wt 88.5 kg (195 lb)   SpO2 100%   BMI 26.45 kg/m   Physical Exam  Constitutional: He is oriented to person, place, and time. He appears well-developed and well-nourished.  HENT:  Head: Normocephalic and atraumatic.  Neck: Normal range of motion. Neck supple.  Cardiovascular: Normal rate.  Pulmonary/Chest: Effort normal.  Musculoskeletal: He exhibits no edema or tenderness.  Positive tenderness to the L3-L4 area.  There is also tenderness in the musculature bilaterally.  No pain directly over the sciatic nerve.  Positive straight leg raise bilaterally.  He has normal and symmetric patellar reflexes bilaterally.  He has normal sensation and motor function to lower extremities.   Pedal pulses are intact.  Neurological: He is alert and oriented to person, place, and time.  Skin: Skin is warm and dry.  Psychiatric: He has a normal mood and affect.     ED Treatments / Results  Labs (all labs ordered are listed, but only abnormal results are displayed) Labs Reviewed - No data to display  EKG None  Radiology No results found.  Procedures Procedures (including critical care time)  Medications Ordered in ED Medications  dexamethasone (DECADRON) injection 10 mg (has no administration in time range)     Initial Impression / Assessment and Plan / ED Course  I have reviewed the triage vital signs and the nursing notes.  Pertinent labs & imaging results that were available during my care of the patient were reviewed by me and considered in my medical decision making (see chart for details).     Patient is a 42 year old male who presents with back pain.  He does not have any symptoms that would warrant imaging at this point.  He has no suggestions of cauda equina.  No neurologic deficits.  He was discharged home in good condition.  He was given a shot of Decadron in the ED.  He is given prescriptions for Flexeril and Naprosyn.  He is given a referral to follow-up with orthopedics if his symptoms are not improving.  Return precautions were given.  Final Clinical Impressions(s) / ED Diagnoses   Final diagnoses:  Acute bilateral low back pain with left-sided sciatica    ED Discharge Orders        Ordered    naproxen (NAPROSYN) 500 MG tablet  2 times daily with meals     04/01/18 0907    cyclobenzaprine (FLEXERIL) 10 MG tablet  2 times daily PRN     04/01/18 [id number]       Rolan BuccoBelfi, Mikell Kazlauskas, MD 04/01/18 [id number]

## 2018-04-01 NOTE — ED Notes (Signed)
WARM COMPRESS GIVEN 

## 2018-04-01 NOTE — ED Notes (Signed)
ED Provider at bedside. BELFI

## 2018-04-01 NOTE — ED Triage Notes (Signed)
Patient c/o bilateral lower back pain. L>R. Patient has shooting pain down the left leg x 1 week, but worse today. Patient does lifting at work.

## 2018-04-17 ENCOUNTER — Encounter: Payer: Self-pay | Admitting: *Deleted

## 2018-06-21 ENCOUNTER — Inpatient Hospital Stay: Admission: RE | Admit: 2018-06-21 | Payer: PRIVATE HEALTH INSURANCE | Source: Ambulatory Visit

## 2019-09-15 IMAGING — CT CT CHEST W/O CM
2 of 3 series · 15 of 36 positions shown, 18 images · non-contrast
Comparison: CT 05/18/2016

CLINICAL DATA: Followup indeterminate pulmonary nodule.

EXAM:
CT CHEST WITHOUT CONTRAST
TECHNIQUE: Multidetector CT imaging of the chest was performed following the
standard protocol without IV contrast.

[Series 2: thorax · axial · 0.80mm/px · z∈[-336,-58]mm · 12 of 165 slices shown, 15 images]
[im 13/165  mediastinal]
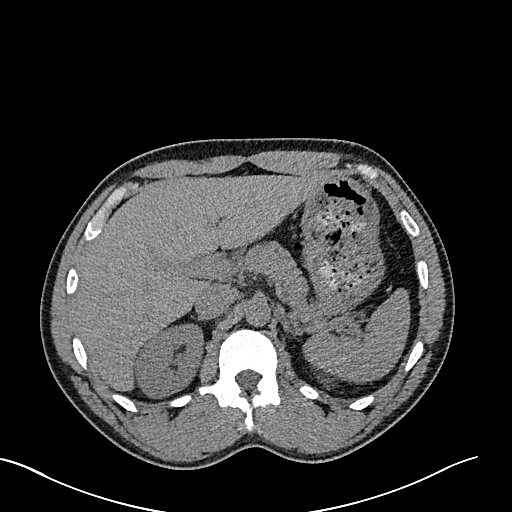
[im 13/165  lung]
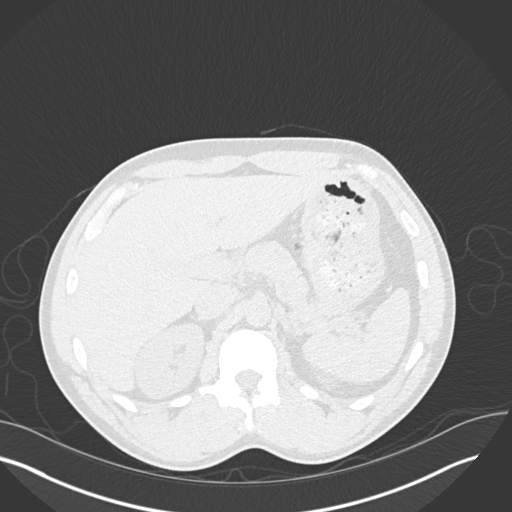
[im 25/165  lung]
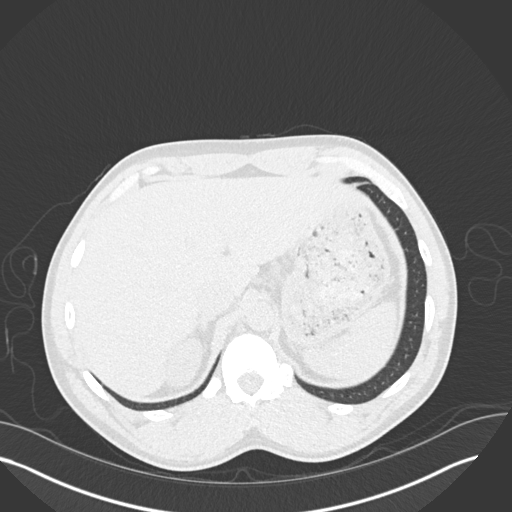
[im 37/165  lung]
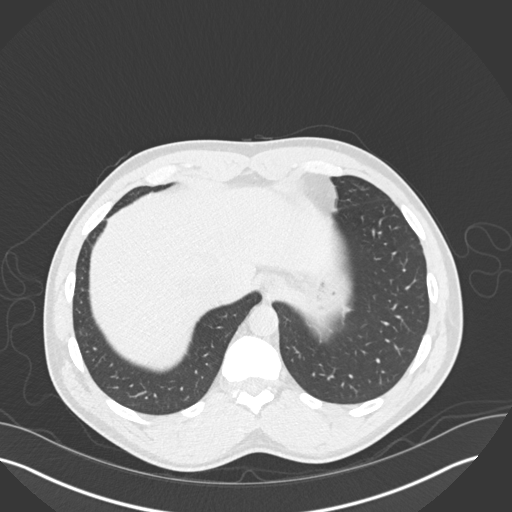
[im 49/165  lung]
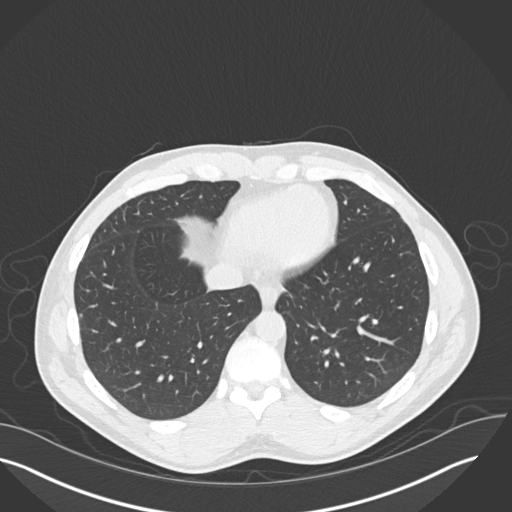
[im 61/165  mediastinal]
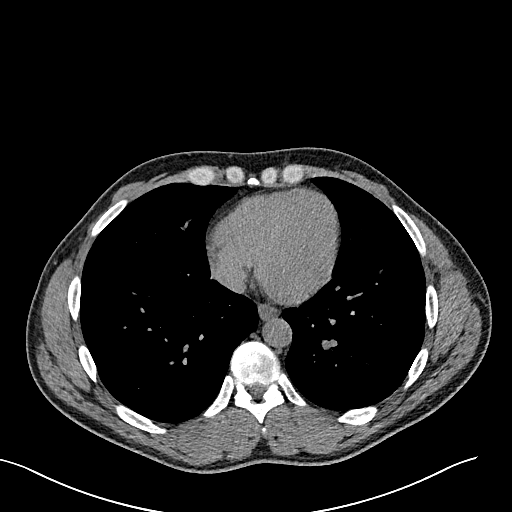
[im 61/165  lung]
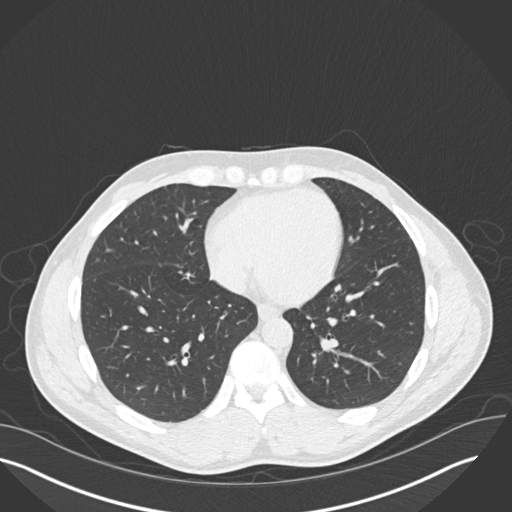
[im 73/165  lung]
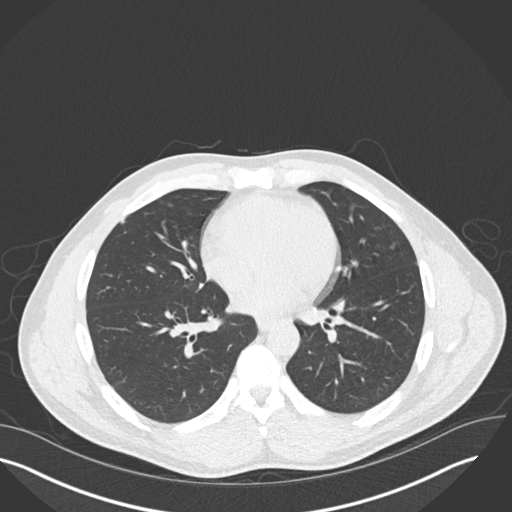
[im 92/165  lung]
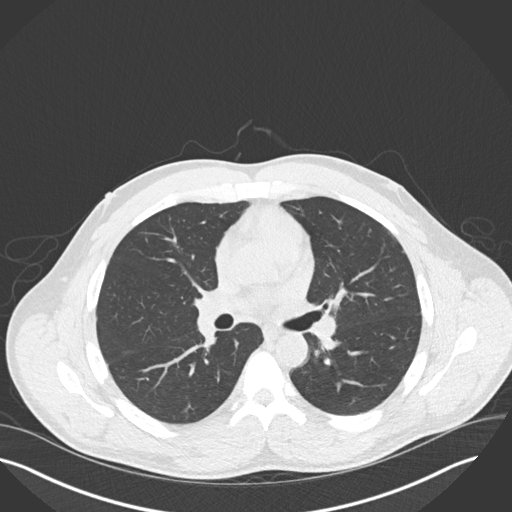
[im 104/165  lung]
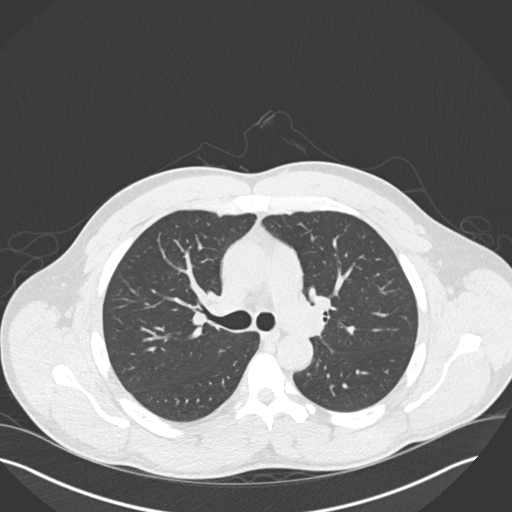
[im 116/165  mediastinal]
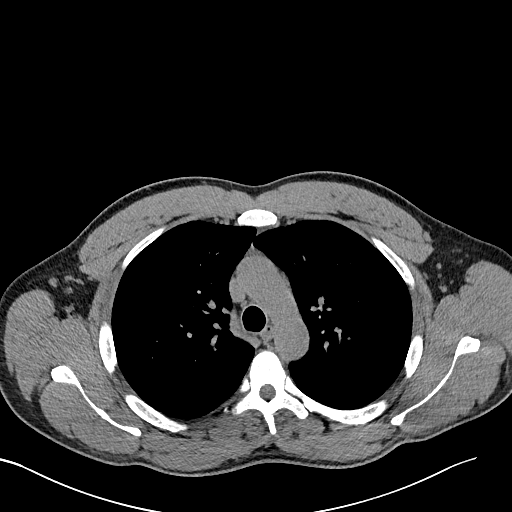
[im 116/165  lung]
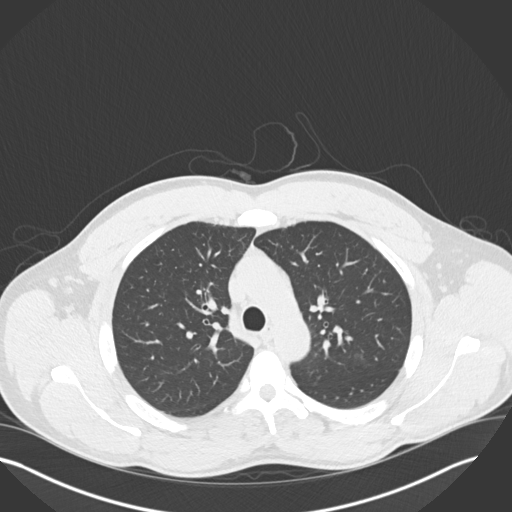
[im 128/165  lung]
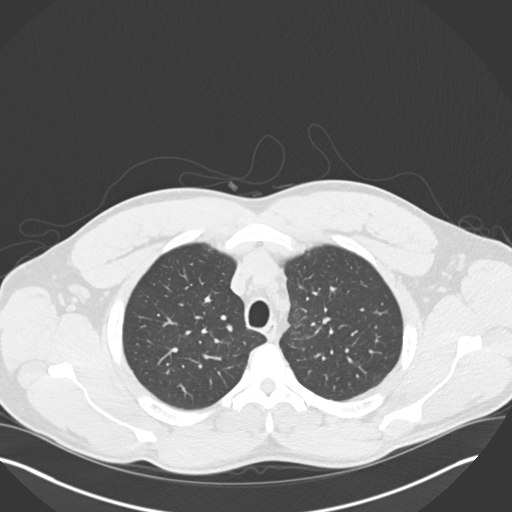
[im 140/165  lung]
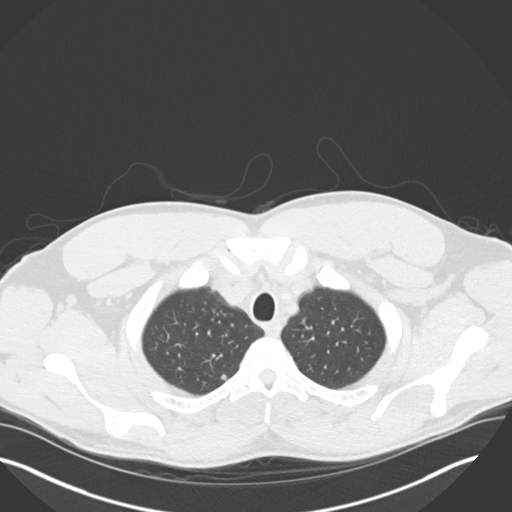
[im 152/165  lung]
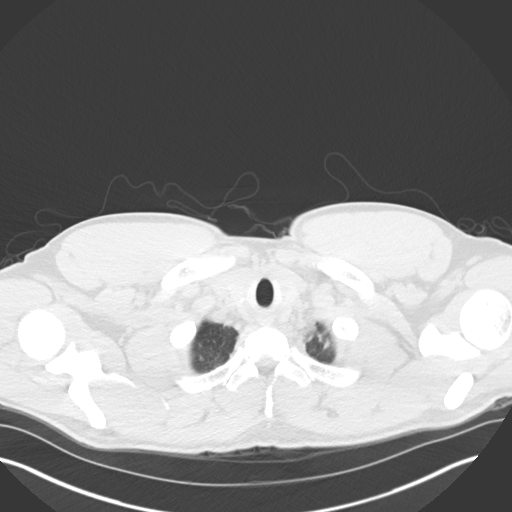

[Series 5: coronal · coronal · 0.64mm/px · 3 of 125 slices shown]
[im 25/125  lung]
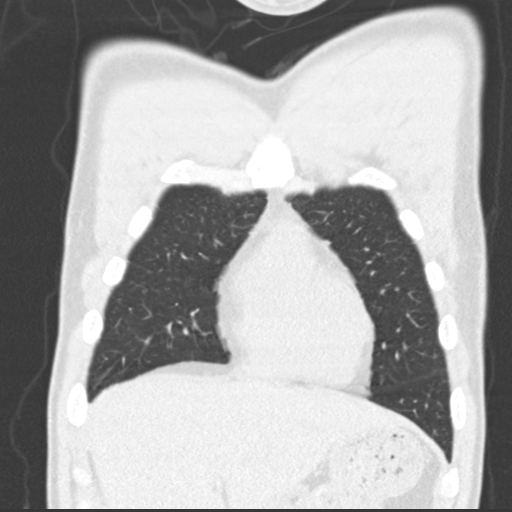
[im 50/125  lung]
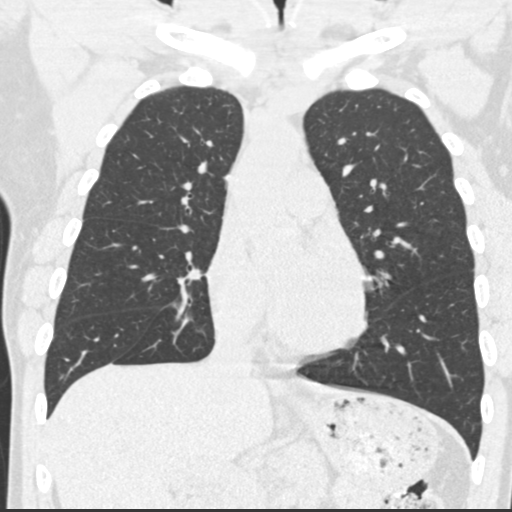
[im 75/125  lung]
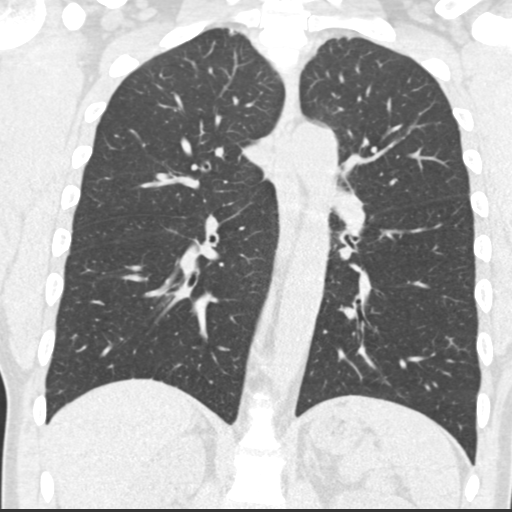

[15 of 36 positions shown; findings below may reference images not displayed]

FINDINGS: Cardiovascular: No significant vascular findings. Normal heart size.
No pericardial effusion.

Mediastinum/Nodes: No axillary or supraclavicular adenopathy. No
mediastinal adenopathy. No pericardial fluid. Esophagus.

Lungs/Pleura: Rounded 6 mm nodule in the LEFT lower lobe (image 85,
series 3) is not changed from 6 mm on comparison exam. Small
calcified subpleural nodule in the RIGHT lower lobe on image 82,
series 3 is also unchanged. No new pulmonary nodules.

Upper Abdomen: Limited view of the liver, kidneys, pancreas are
unremarkable. Normal adrenal glands.

Musculoskeletal: No aggressive osseous lesion.
IMPRESSION: Stable 6 mm pulmonary nodule over 1 year interval. Follow up in 12
months (from today's scan) is considered optional for low-risk
patients, but is recommended for high-risk patients. This
recommendation follows the consensus statement: Guidelines for
Management of Incidental Pulmonary Nodules Detected on CT Images:

## 2020-03-28 ENCOUNTER — Encounter (HOSPITAL_COMMUNITY): Payer: Self-pay | Admitting: Family Medicine

## 2020-03-28 ENCOUNTER — Other Ambulatory Visit: Payer: Self-pay

## 2020-03-28 ENCOUNTER — Ambulatory Visit (HOSPITAL_COMMUNITY)
Admission: EM | Admit: 2020-03-28 | Discharge: 2020-03-28 | Disposition: A | Discharge status: Home / Self Care | Payer: PRIVATE HEALTH INSURANCE | Attending: Family Medicine | Admitting: Family Medicine

## 2020-03-28 DIAGNOSIS — L03032 Cellulitis of left toe: Secondary | ICD-10-CM

## 2020-03-28 MED ORDER — DOXYCYCLINE HYCLATE 100 MG PO TABS: 100.0000 mg | ORAL_TABLET | Freq: Two times a day (BID) | ORAL | 0 refills | Status: AC

## 2020-03-28 NOTE — Discharge Instructions (Addendum)
Keep soaking the toe in soapy water twice daily and try to gently express any remaining pus

## 2020-03-28 NOTE — ED Provider Notes (Signed)
[facility]    CSN: [id number] Arrival date & time: 03/28/20  1659      History   Chief Complaint Chief Complaint  Patient presents with  . Paronychia    HPI Keith Green is a 44 y.o. male.   Initial [facility] patient visit.  44 yo man with paronychia in left great toe.  Wife has been expressing pus for a day or so, but there is more pus there.  No significant pain unless palpated.  Does landscaping     Past Medical History:  Diagnosis Date  . Allergy    Seasonal allergic rhinitis  . Chest pain 04/2016  . HLD (hyperlipidemia)     Patient Active Problem List   Diagnosis Date Noted  . Arthralgia of right acromioclavicular joint 09/07/2017  . Callus of foot 09/01/2016  . Chest pain 05/18/2016  . LVH (left ventricular hypertrophy)   . Abnormal ECG   . Encounter for smoking cessation counseling   . Left knee pain 05/12/2016  . Allergic rhinitis 12/15/2015  . Health care maintenance 12/15/2015  . TOBACCO ABUSE 10/12/2010    Past Surgical History:  Procedure Laterality Date  . KNEE ARTHROSCOPY Left        Home Medications    Prior to Admission medications   Medication Sig Start Date End Date Taking? Authorizing Provider  cyclobenzaprine (FLEXERIL) 10 MG tablet Take 1 tablet (10 mg total) by mouth 2 (two) times daily as needed for muscle spasms. 04/01/18   Rolan Bucco, MD  doxycycline (VIBRA-TABS) 100 MG tablet Take 1 tablet (100 mg total) by mouth 2 (two) times daily. 03/28/20   Elvina Sidle, MD  cetirizine (ZYRTEC) 10 MG tablet Take 1 tablet (10 mg total) by mouth daily. Patient not taking: Reported on 04/01/2018 09/07/17 03/28/20  Fuller Plan, MD  fluticasone [facility]) 50 MCG/ACT nasal spray SPRAY 2 SPRAYS INTO EACH NOSTRIL EVERY DAY Patient not taking: Reported on 04/01/2018 12/17/17 03/28/20  Fuller Plan, MD    Family History Family History  Problem Relation Age of Onset  . Hypertension Mother     Social History Social  History   Tobacco Use  . Smoking status: Current Some Day Smoker    Packs/day: 1.00    Years: 25.00    Pack years: 25.00    Types: Cigarettes  . Smokeless tobacco: Never Used  . Tobacco comment: down to 5-8 cigarettes daily 07/19/16  Vaping Use  . Vaping Use: Never used  Substance Use Topics  . Alcohol use: No    Alcohol/week: 0.0 standard drinks  . Drug use: No     Allergies   Patient has no known allergies.   Review of Systems Review of Systems  Constitutional: Negative.   Skin: Positive for wound.  All other systems reviewed and are negative.    Physical Exam Triage Vital Signs ED Triage Vitals  Enc Vitals Group     BP      Pulse      Resp      Temp      Temp src      SpO2      Weight      Height      Head Circumference      Peak Flow      Pain Score      Pain Loc      Pain Edu?      Excl. in GC?    No data found.  Updated Vital Signs BP 129/86  Pulse 77   Temp 99.5 F (37.5 C)   Resp 16   SpO2 97%    Physical Exam Vitals and nursing note reviewed.  Constitutional:      Appearance: Normal appearance. He is normal weight.  Eyes:     Conjunctiva/sclera: Conjunctivae normal.  Abdominal:     General: Bowel sounds are normal.  Musculoskeletal:        General: Normal range of motion.     Cervical back: Normal range of motion and neck supple.  Skin:    General: Skin is warm.     Findings: Lesion present.     Comments: Paronychia left great toe incised and drained without complication  Neurological:     General: No focal deficit present.     Mental Status: He is alert.  Psychiatric:        Mood and Affect: Mood normal.      UC Treatments / Results  Labs (all labs ordered are listed, but only abnormal results are displayed) Labs Reviewed - No data to display  EKG   Radiology No results found.  Procedures Procedures (including critical care time)  Medications Ordered in UC Medications - No data to display  Initial  Impression / Assessment and Plan / UC Course  I have reviewed the triage vital signs and the nursing notes.  Pertinent labs & imaging results that were available during my care of the patient were reviewed by me and considered in my medical decision making (see chart for details).    Final Clinical Impressions(s) / UC Diagnoses   Final diagnoses:  Paronychia of great toe of left foot     Discharge Instructions     Keep soaking the toe in soapy water twice daily and try to gently express any remaining pus    ED Prescriptions    Medication Sig Dispense Auth. Provider   doxycycline (VIBRA-TABS) 100 MG tablet Take 1 tablet (100 mg total) by mouth 2 (two) times daily. 14 tablet Elvina Sidle, MD     I have reviewed the PDMP during this encounter.   Elvina Sidle, MD 03/28/20 1754

## 2020-03-28 NOTE — ED Triage Notes (Signed)
Patient reports paronychia on left big toe x3 days. Reports his wife was able to squeeze out a moderate amount of pus.

## 2021-02-05 ENCOUNTER — Other Ambulatory Visit: Payer: Self-pay

## 2021-02-05 ENCOUNTER — Emergency Department (HOSPITAL_COMMUNITY): Payer: Managed Care, Other (non HMO)

## 2021-02-05 ENCOUNTER — Emergency Department (HOSPITAL_COMMUNITY)
Admission: EM | Admit: 2021-02-05 | Discharge: 2021-02-05 | Disposition: A | Discharge status: Home / Self Care | Payer: Managed Care, Other (non HMO) | Attending: Emergency Medicine | Admitting: Emergency Medicine

## 2021-02-05 DIAGNOSIS — Z5321 Procedure and treatment not carried out due to patient leaving prior to being seen by health care provider: Secondary | ICD-10-CM | POA: Insufficient documentation

## 2021-02-05 DIAGNOSIS — R079 Chest pain, unspecified: Secondary | ICD-10-CM | POA: Insufficient documentation

## 2021-02-05 DIAGNOSIS — R0602 Shortness of breath: Secondary | ICD-10-CM | POA: Insufficient documentation

## 2021-02-05 LAB — CBC
HCT: 43.1 % (ref 39.0–52.0)
Hemoglobin: 14.1 g/dL (ref 13.0–17.0)
MCH: 30.4 pg (ref 26.0–34.0)
MCHC: 32.7 g/dL (ref 30.0–36.0)
MCV: 92.9 fL (ref 80.0–100.0)
Platelets: 327 10*3/uL (ref 150–400)
RBC: 4.64 MIL/uL (ref 4.22–5.81)
RDW: 12.4 % (ref 11.5–15.5)
WBC: 6.8 10*3/uL (ref 4.0–10.5)
nRBC: 0 % (ref 0.0–0.2)

## 2021-02-05 LAB — TROPONIN I (HIGH SENSITIVITY): Troponin I (High Sensitivity): <2 ng/L (ref <18)

## 2021-02-05 LAB — BASIC METABOLIC PANEL
Anion gap: 7 (ref 5–15)
BUN: 6 mg/dL (ref 6–20)
CO2: 29 mmol/L (ref 22–32)
Calcium: 9.4 mg/dL (ref 8.9–10.3)
Chloride: 99 mmol/L (ref 98–111)
Creatinine, Ser: 0.93 mg/dL (ref 0.61–1.24)
GFR, Estimated: >60 mL/min (ref >60)
Glucose, Bld: 106 mg/dL — ABNORMAL HIGH (ref 70–99)
Potassium: 4.2 mmol/L (ref 3.5–5.1)
Sodium: 135 mmol/L (ref 135–145)

## 2021-02-05 NOTE — ED Triage Notes (Signed)
Pt reports L sided chest pain since last night while lying in bed. Pt is a smoker, so reports his shortness of breath is no more than normal. Pt was intermittent but is becoming more constant.

## 2021-02-16 ENCOUNTER — Encounter: Payer: Self-pay | Admitting: *Deleted

## 2021-03-29 ENCOUNTER — Encounter: Payer: Self-pay | Admitting: *Deleted

## 2023-05-11 IMAGING — CR DG CHEST 2V
2 series · 2 of 2 positions shown · non-contrast
Comparison: Chest radiograph 02/10/2017

CLINICAL DATA: Cp, SOB

EXAM:
CHEST - 2 VIEW

[chest pa]
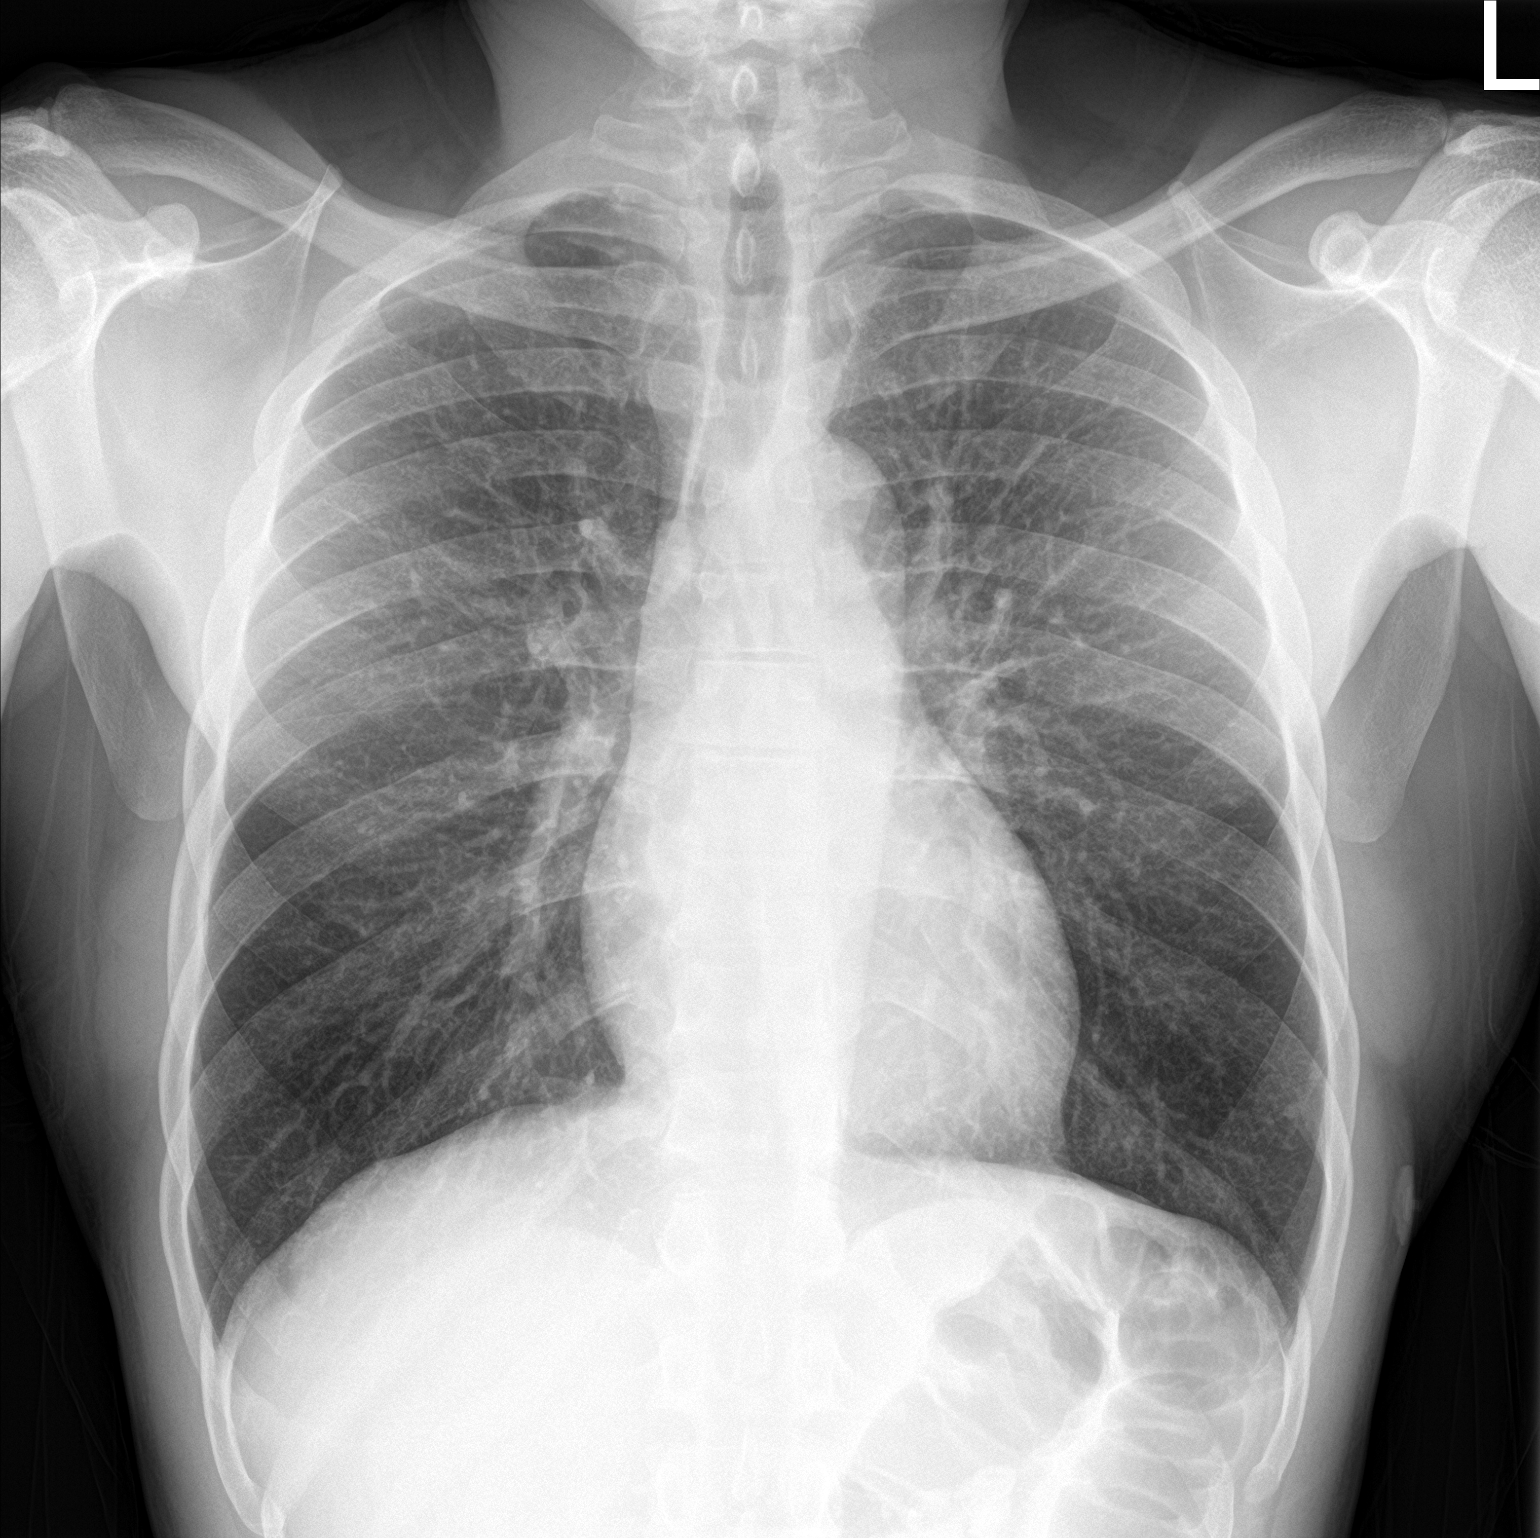

[chest lat]
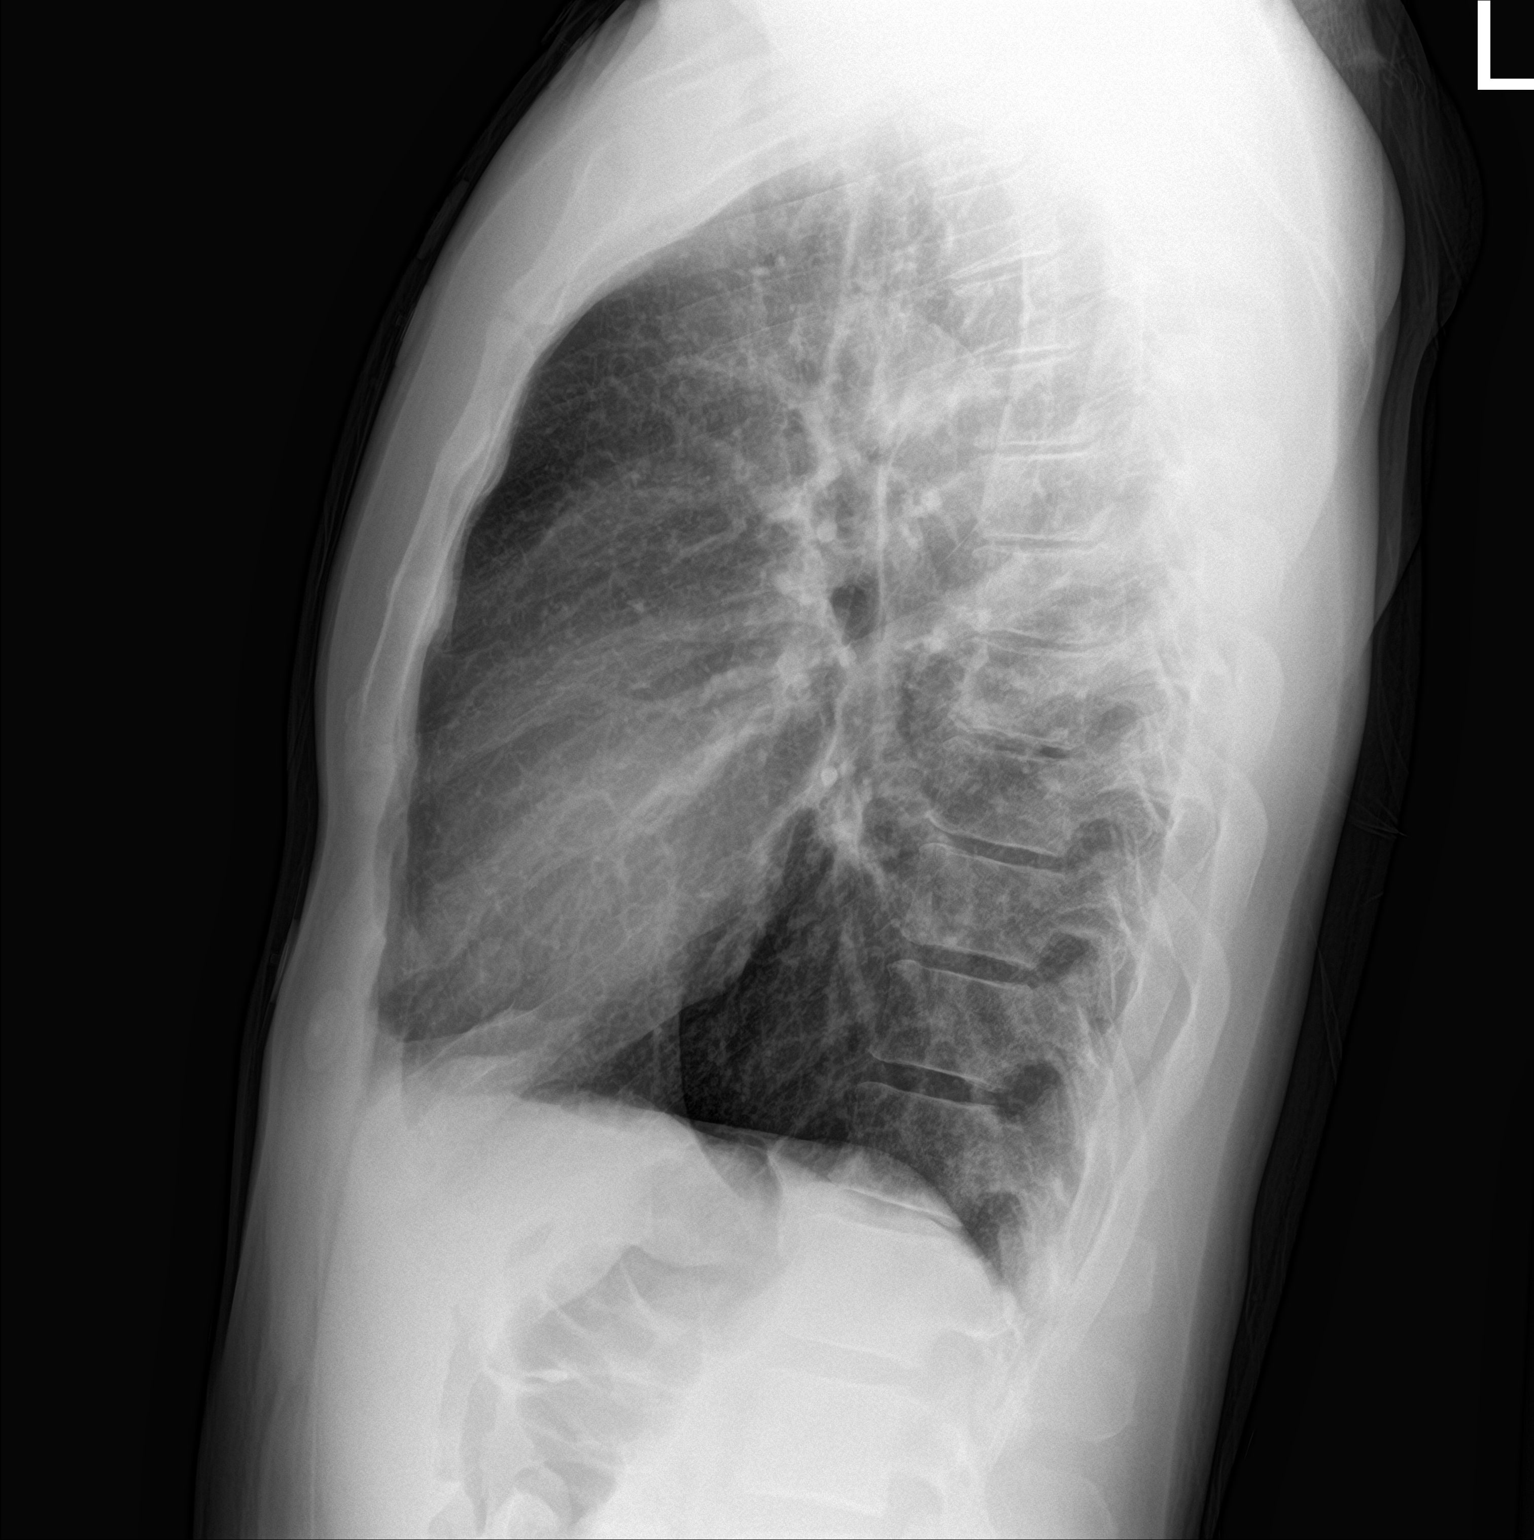

[2 of 2 positions shown; findings below may reference images not displayed]

FINDINGS: The cardiomediastinal contours are within normal limits. The lungs
are clear. No pneumothorax or pleural effusion. No acute finding in
the visualized skeleton.
IMPRESSION: No acute cardiopulmonary process.
